# Patient Record
Sex: Male | Born: 1935 | Race: White | Hispanic: No
Health system: Southern US, Community
[De-identification: ages and names within clinical notes are randomized; demographics above are authoritative.]

---

## 2017-02-23 ENCOUNTER — Inpatient Hospital Stay
Admission: AD | Admit: 2017-02-23 | Discharge: 2017-03-17 | Disposition: A | Discharge status: Other Acute Inpatient Hospital | Payer: Self-pay | Source: Ambulatory Visit | Attending: Internal Medicine | Admitting: Internal Medicine

## 2017-02-23 ENCOUNTER — Other Ambulatory Visit (HOSPITAL_COMMUNITY): Payer: Self-pay

## 2017-02-23 DIAGNOSIS — K567 Ileus, unspecified: Secondary | ICD-10-CM

## 2017-02-23 DIAGNOSIS — Z931 Gastrostomy status: Secondary | ICD-10-CM

## 2017-02-23 DIAGNOSIS — R0902 Hypoxemia: Secondary | ICD-10-CM

## 2017-02-23 DIAGNOSIS — J9690 Respiratory failure, unspecified, unspecified whether with hypoxia or hypercapnia: Secondary | ICD-10-CM

## 2017-02-23 DIAGNOSIS — T68XXXA Hypothermia, initial encounter: Secondary | ICD-10-CM

## 2017-02-24 LAB — RENAL FUNCTION PANEL
ANION GAP: 10 (ref 5–15)
Albumin: 2.3 g/dL — ABNORMAL LOW (ref 3.5–5.0)
BUN: 97 mg/dL — ABNORMAL HIGH (ref 6–20)
CALCIUM: 8.2 mg/dL — AB (ref 8.9–10.3)
CHLORIDE: 92 mmol/L — AB (ref 101–111)
CO2: 26 mmol/L (ref 22–32)
Creatinine, Ser: 2.64 mg/dL — ABNORMAL HIGH (ref 0.61–1.24)
GFR calc non Af Amer: 21 mL/min — ABNORMAL LOW (ref >60)
GFR, EST AFRICAN AMERICAN: 24 mL/min — AB (ref >60)
Glucose, Bld: 194 mg/dL — ABNORMAL HIGH (ref 65–99)
Phosphorus: 3.2 mg/dL (ref 2.5–4.6)
Potassium: 4.6 mmol/L (ref 3.5–5.1)
Sodium: 128 mmol/L — ABNORMAL LOW (ref 135–145)

## 2017-02-24 LAB — CBC
HCT: 25.5 % — ABNORMAL LOW (ref 39.0–52.0)
HEMATOCRIT: 22.6 % — AB (ref 39.0–52.0)
HEMOGLOBIN: 7.4 g/dL — AB (ref 13.0–17.0)
HEMOGLOBIN: 8.2 g/dL — AB (ref 13.0–17.0)
MCH: 29.6 pg (ref 26.0–34.0)
MCH: 29.1 pg (ref 26.0–34.0)
MCHC: 32.7 g/dL (ref 30.0–36.0)
MCHC: 32.2 g/dL (ref 30.0–36.0)
MCV: 90.4 fL (ref 78.0–100.0)
MCV: 90.4 fL (ref 78.0–100.0)
Platelets: 221 10*3/uL (ref 150–400)
Platelets: 244 10*3/uL (ref 150–400)
RBC: 2.5 MIL/uL — ABNORMAL LOW (ref 4.22–5.81)
RBC: 2.82 MIL/uL — AB (ref 4.22–5.81)
RDW: 17.4 % — ABNORMAL HIGH (ref 11.5–15.5)
RDW: 17 % — ABNORMAL HIGH (ref 11.5–15.5)
WBC: 7.7 10*3/uL (ref 4.0–10.5)
WBC: 8.1 10*3/uL (ref 4.0–10.5)

## 2017-02-24 LAB — COMPREHENSIVE METABOLIC PANEL
ALT: 15 U/L — ABNORMAL LOW (ref 17–63)
ANION GAP: 10 (ref 5–15)
AST: 36 U/L (ref 15–41)
Albumin: 2.2 g/dL — ABNORMAL LOW (ref 3.5–5.0)
Alkaline Phosphatase: 372 U/L — ABNORMAL HIGH (ref 38–126)
BUN: 92 mg/dL — ABNORMAL HIGH (ref 6–20)
CHLORIDE: 93 mmol/L — AB (ref 101–111)
CO2: 26 mmol/L (ref 22–32)
Calcium: 8.2 mg/dL — ABNORMAL LOW (ref 8.9–10.3)
Creatinine, Ser: 2.59 mg/dL — ABNORMAL HIGH (ref 0.61–1.24)
GFR calc non Af Amer: 21 mL/min — ABNORMAL LOW (ref >60)
GFR, EST AFRICAN AMERICAN: 24 mL/min — AB (ref >60)
Glucose, Bld: 154 mg/dL — ABNORMAL HIGH (ref 65–99)
POTASSIUM: 4.5 mmol/L (ref 3.5–5.1)
SODIUM: 129 mmol/L — AB (ref 135–145)
Total Bilirubin: 0.9 mg/dL (ref 0.3–1.2)
Total Protein: 5.1 g/dL — ABNORMAL LOW (ref 6.5–8.1)

## 2017-02-24 LAB — PROTIME-INR
INR: 1.08
Prothrombin Time: 14.1 seconds (ref 11.4–15.2)

## 2017-02-24 NOTE — Consult Note (Signed)
[facility] CONSULT NOTE    Date: 02/24/2017                  Patient Name:  Terressa KoyanagiBillye E August  MRN: [id number]  DOB: [birth year]  Age / Sex: 83 y.o., male         PCP: System, Pcp Not In                 Service Requesting Consult: Hospitalist                 Reason for Consult: Acute renal failure, CKD stage III            History of Present Illness: Patient is a 83 y.o. male with a PMHx of congestive heart failure, chronic kidney disease stage III, atrial fibrillation, diabetes mellitus type 2, coronary artery disease, hypothyroidism, hyperlipidemia, who was admitted to [facility] on 02/23/2017 for ongoing treatment.  Patient originally underwent CABG on 01/11/2017. The patient had decompensation post surgical with a right ventricular failure and was placed on ECMO. Subsequently the patient was improved and was taken off of ECMO on 01/18/17. Thereafter he developed acute renal failure and ended up requiring continuous renal replacement therapy and then the patient was transitioned to an intermittent hemodialysis. In addition he had a tracheostomy placed on 02/08/17 as well as a PEG tube on 02/10/17. The patient was receiving hemodialysis on a Monday, Wednesday, and Friday dialysis schedule. He has a right internal jugular PermCath in place. He is due for hemodialysis today therefore.   Medications: Humalog sliding scale, atorvastatin 10 mg eac bedtime, Pepcid 20 mg daily, HCTZ 12.5 mg daily, Synthroid 150 mg daily, losartan 50 mg daily, Lovenox 30 mg daily, melatonin 3 mg each bedtime, metoprolol 50 mg twice a day, multivitamin 1 tablet daily, thiamine 100 mg daily  Current medications: No current facility-administered medications for this encounter.       Allergies: sulfa   Past Medical History: congestive heart failure, chronic kidney disease stage III, atrial fibrillation, diabetes mellitus type 2, coronary artery disease, hypothyroidism,  hyperlipidemia,  Past Surgical History: Status post CABG Tracheostomy 02/09/79 PEG tube 02/10/17 Right internal jugular PermCath  Family History: Unable to obtain from the patient as he has a tracheostomy  Social History: Unable to obtain from the patient as he has a tracheostomy in place.   Review of Systems: Unable to obtain from the patient as he has a tracheostomy in place.  Vital Signs: Temperature 96.7 pulse 76 respirations 20 blood pressure 99/54 Weight trends: There were no vitals filed for this visit.  Physical Exam: General: No acute distress  Head: Normocephalic, atraumatic.  Eyes: Anicteric, EOMI  Nose: Mucous membranes moist, not inflammed, nonerythematous.  Throat: Oropharynx nonerythematous, no exudate appreciated.   Neck: Tracheostomy in place  Lungs:  Normal respiratory effort. Scattered rhonchi bilateral.  Heart: S1, S2, no rubs or gallops  Abdomen:  BS normoactive. Soft, Nondistended, non-tender.  No masses or organomegaly.  Extremities: 1+ peripheral edema  Neurologic: Awake, alert, following commands  Skin: No visible rashes, scars.    Lab results: Basic Metabolic Panel:  Recent Labs Lab 02/24/17 0626  NA 129*  K 4.5  CL 93*  CO2 26  GLUCOSE 154*  BUN 92*  CREATININE 2.59*  CALCIUM 8.2*    Liver Function Tests:  Recent Labs Lab 02/24/17 0626  AST 36  ALT 15*  ALKPHOS 372*  BILITOT 0.9  PROT 5.1*  ALBUMIN 2.2*   No  results for input(s): LIPASE, AMYLASE in the last 168 hours. No results for input(s): AMMONIA in the last 168 hours.  CBC:  Recent Labs Lab 02/24/17 0626  WBC 7.7  HGB 7.4*  HCT 22.6*  MCV 90.4  PLT 221    Cardiac Enzymes: No results for input(s): CKTOTAL, CKMB, CKMBINDEX, TROPONINI in the last 168 hours.  BNP: Invalid input(s): POCBNP  CBG: No results for input(s): GLUCAP in the last 168 hours.  Microbiology: No results found for this or any previous visit.  Coagulation Studies:  Recent Labs   02/24/17 0626  LABPROT 14.1  INR 1.08    Urinalysis: No results for input(s): COLORURINE, LABSPEC, PHURINE, GLUCOSEU, HGBUR, BILIRUBINUR, KETONESUR, PROTEINUR, UROBILINOGEN, NITRITE, LEUKOCYTESUR in the last 72 hours.  Invalid input(s): APPERANCEUR    Imaging: Dg Chest Port 1 View  Result Date: 02/23/2017 CLINICAL DATA:  Respiratory failure EXAM: PORTABLE CHEST 1 VIEW COMPARISON:  None. FINDINGS: A tracheostomy tube is identified. A double-lumen central line on the right is in good position. The distal tip of a right PICC line is obscured by the right central line. No pneumothorax. Probable effusions and underlying atelectasis, particularly on the right. Mild opacity in left mid lung may represent atelectasis or early infiltrate. No other acute abnormalities. No overt edema. IMPRESSION: Probable effusions and underlying atelectasis. Atelectasis versus early infiltrate in the left mid lung. Recommend attention on follow-up. Electronically Signed   By: Gerome Sam III M.D   On: 02/23/2017 17:33   Dg Abd Portable 1v  Result Date: 02/23/2017 CLINICAL DATA:  Evaluate percutaneous G-tube placement EXAM: PORTABLE ABDOMEN - 1 VIEW COMPARISON:  None. FINDINGS: Radioactive seeds are seen in the prostate bed. Prominent air-filled loops of large and small bowel may represent ileus. A G-tube overlies the proximal body of the stomach. No other acute abnormalities. IMPRESSION: A G tube overlies the proximal body of the stomach. Air-filled loops of large and small bowel could represent ileus. Recommend clinical correlation and follow-up as clinically warranted. Electronically Signed   By: Gerome Sam III M.D   On: 02/23/2017 17:35      Assessment & Plan: Pt is a 83 y.o. male congestive heart failure, chronic kidney disease stage III, atrial fibrillation, diabetes mellitus type 2, coronary artery disease, hypothyroidism, hyperlipidemia, who was admitted to [facility] on 02/23/2017 for  ongoing treatment.  Patient originally underwent CABG on 01/11/2017. The patient had decompensation post surgical with a right ventricular failure and was placed on ECMO, taken off of ECMO on 01/18/17. He then developed acute renal failure and ended up requiring continuous renal replacement therapy and then the patient was transitioned to an intermittent hemodialysis. Patient had tracheostomy placed on 02/08/17 as well as a PEG tube on 02/10/17.   1.  Acute renal failure/chronic kidney disease stage III. The patient has been requiring intermittent hemodialysis. We will continue this for now. He has a right internal jugular PermCath in place which we will use. We will plan for dialysis today with ultrafiltration target of 1.5 kg.  2. Anemia of chronic kidney disease. Hemoglobin currently 7.4. We will start the patient on Aranesp 60 mcg subcutaneous weekly.  3. Secondary hyperparathyroidism. Plan to check PTH as well as phosphorus with hemodialysis.  4. Acute respiratory failure. Patient currently off the Allegra but still has a tracheostomy in place. He appears to be progressing relatively well.  5. Thanks for consultation.

## 2017-02-25 LAB — BASIC METABOLIC PANEL
ANION GAP: 9 (ref 5–15)
BUN: 60 mg/dL — ABNORMAL HIGH (ref 6–20)
CHLORIDE: 98 mmol/L — AB (ref 101–111)
CO2: 25 mmol/L (ref 22–32)
Calcium: 8.2 mg/dL — ABNORMAL LOW (ref 8.9–10.3)
Creatinine, Ser: 1.97 mg/dL — ABNORMAL HIGH (ref 0.61–1.24)
GFR calc Af Amer: 34 mL/min — ABNORMAL LOW (ref >60)
GFR calc non Af Amer: 29 mL/min — ABNORMAL LOW (ref >60)
GLUCOSE: 161 mg/dL — AB (ref 65–99)
Potassium: 4.1 mmol/L (ref 3.5–5.1)
Sodium: 132 mmol/L — ABNORMAL LOW (ref 135–145)

## 2017-02-27 LAB — RENAL FUNCTION PANEL
ANION GAP: 9 (ref 5–15)
Albumin: 2.1 g/dL — ABNORMAL LOW (ref 3.5–5.0)
BUN: 92 mg/dL — ABNORMAL HIGH (ref 6–20)
CO2: 27 mmol/L (ref 22–32)
Calcium: 8.1 mg/dL — ABNORMAL LOW (ref 8.9–10.3)
Chloride: 95 mmol/L — ABNORMAL LOW (ref 101–111)
Creatinine, Ser: 2.52 mg/dL — ABNORMAL HIGH (ref 0.61–1.24)
GFR, EST AFRICAN AMERICAN: 25 mL/min — AB (ref >60)
GFR, EST NON AFRICAN AMERICAN: 22 mL/min — AB (ref >60)
Glucose, Bld: 222 mg/dL — ABNORMAL HIGH (ref 65–99)
PHOSPHORUS: 3.3 mg/dL (ref 2.5–4.6)
POTASSIUM: 3.7 mmol/L (ref 3.5–5.1)
SODIUM: 131 mmol/L — AB (ref 135–145)

## 2017-02-27 LAB — CBC
HEMATOCRIT: 23.4 % — AB (ref 39.0–52.0)
HEMOGLOBIN: 7.3 g/dL — AB (ref 13.0–17.0)
MCH: 29.3 pg (ref 26.0–34.0)
MCHC: 31.2 g/dL (ref 30.0–36.0)
MCV: 94 fL (ref 78.0–100.0)
Platelets: 216 10*3/uL (ref 150–400)
RBC: 2.49 MIL/uL — AB (ref 4.22–5.81)
RDW: 17.8 % — ABNORMAL HIGH (ref 11.5–15.5)
WBC: 5.7 10*3/uL (ref 4.0–10.5)

## 2017-02-27 LAB — HEPATITIS B CORE ANTIBODY, IGM: HEP B C IGM: NEGATIVE

## 2017-02-27 LAB — HEPATITIS B SURFACE ANTIGEN: Hepatitis B Surface Ag: NEGATIVE

## 2017-02-27 LAB — HEPATITIS B SURFACE ANTIBODY,QUALITATIVE: Hep B S Ab: NONREACTIVE

## 2017-02-27 NOTE — Progress Notes (Signed)
[facility]  ROUNDING NOTE   Subjective:  Patient completed hemodialysis today. Blood pressure did not allow for significant ultrafiltration. Patient was given albumin.    Objective:  Vital signs in last 24 hours:  Temperature 98 pulse 70 respirations 20 blood pressure 142/68   Physical Exam: General: No acute distress  Head: Normocephalic, atraumatic. Moist oral mucosal membranes  Eyes: Anicteric  Neck: Trach in place, ATC  Lungs:  Scattered rhonchi, normal effort  Heart: S1S2 no rubs  Abdomen:  Soft, nontender, bowel sounds present  Extremities: trace peripheral edema.  Neurologic: Awake, alert, following commands  Skin: No lesions       Basic Metabolic Panel:  Recent Labs Lab 02/24/17 0626 02/24/17 1900 02/25/17 1239 02/27/17 0631  NA 129* 128* 132* 131*  K 4.5 4.6 4.1 3.7  CL 93* 92* 98* 95*  CO2 26 26 25 27   GLUCOSE 154* 194* 161* 222*  BUN 92* 97* 60* 92*  CREATININE 2.59* 2.64* 1.97* 2.52*  CALCIUM 8.2* 8.2* 8.2* 8.1*  PHOS  --  3.2  --  3.3    Liver Function Tests:  Recent Labs Lab 02/24/17 0626 02/24/17 1900 02/27/17 0631  AST 36  --   --   ALT 15*  --   --   ALKPHOS 372*  --   --   BILITOT 0.9  --   --   PROT 5.1*  --   --   ALBUMIN 2.2* 2.3* 2.1*   No results for input(s): LIPASE, AMYLASE in the last 168 hours. No results for input(s): AMMONIA in the last 168 hours.  CBC:  Recent Labs Lab 02/24/17 0626 02/24/17 1900 02/27/17 0632  WBC 7.7 8.1 5.7  HGB 7.4* 8.2* 7.3*  HCT 22.6* 25.5* 23.4*  MCV 90.4 90.4 94.0  PLT 221 244 216    Cardiac Enzymes: No results for input(s): CKTOTAL, CKMB, CKMBINDEX, TROPONINI in the last 168 hours.  BNP: Invalid input(s): POCBNP  CBG: No results for input(s): GLUCAP in the last 168 hours.  Microbiology: No results found for this or any previous visit.  Coagulation Studies: No results for input(s): LABPROT, INR in the last 72 hours.  Urinalysis: No results for input(s):  COLORURINE, LABSPEC, PHURINE, GLUCOSEU, HGBUR, BILIRUBINUR, KETONESUR, PROTEINUR, UROBILINOGEN, NITRITE, LEUKOCYTESUR in the last 72 hours.  Invalid input(s): APPERANCEUR    Imaging: No results found.   Medications:       Assessment/ Plan:  83 y.o. male congestive heart failure, chronic kidney disease stage III, atrial fibrillation, diabetes mellitus type 2, coronary artery disease, hypothyroidism, hyperlipidemia, who was [facility] on 02/23/2017 for ongoing treatment.  Patient originally underwent CABG on 01/11/2017. The patient had decompensation post surgical with a right ventricular failure and was placed on ECMO, taken off of ECMO on 01/18/17. He then developed acute renal failure and ended up requiring continuous renal replacement therapy and then the patient was transitioned to an intermittent hemodialysis. Patient had tracheostomy placed on 02/08/17 as well as a PEG tube on 02/10/17.   1.  Acute renal failure/chronic kidney disease stage III. The patient has been requiring intermittent hemodialysis.  - patient completed hemodialysis today. Ultrafiltration was not able to be performed givn her relatively low blood pressure. We will use albumin with dialysis treatments.  2. Anemia of chronic kidney disease. Start Aranesp 60 mcg subcutaneous weekly this week.  3. Secondary hyperparathyroidism. Phosphorus 3.3 and at target. Continue to monitor.  4. Acute respiratory failure.  Tracheostomy functioning well at  the moment.   LOS: 0 Tristan Proto 6/11/20185:16 PM

## 2017-02-28 ENCOUNTER — Other Ambulatory Visit (HOSPITAL_COMMUNITY): Payer: Self-pay

## 2017-03-01 LAB — RENAL FUNCTION PANEL
Albumin: 2.7 g/dL — ABNORMAL LOW (ref 3.5–5.0)
Anion gap: 10 (ref 5–15)
BUN: 68 mg/dL — AB (ref 6–20)
CHLORIDE: 93 mmol/L — AB (ref 101–111)
CO2: 26 mmol/L (ref 22–32)
CREATININE: 2.23 mg/dL — AB (ref 0.61–1.24)
Calcium: 8.3 mg/dL — ABNORMAL LOW (ref 8.9–10.3)
GFR calc non Af Amer: 25 mL/min — ABNORMAL LOW (ref >60)
GFR, EST AFRICAN AMERICAN: 29 mL/min — AB (ref >60)
Glucose, Bld: 253 mg/dL — ABNORMAL HIGH (ref 65–99)
POTASSIUM: 4.6 mmol/L (ref 3.5–5.1)
Phosphorus: 3.7 mg/dL (ref 2.5–4.6)
Sodium: 129 mmol/L — ABNORMAL LOW (ref 135–145)

## 2017-03-01 LAB — CBC
HEMATOCRIT: 27.1 % — AB (ref 39.0–52.0)
HEMOGLOBIN: 8.1 g/dL — AB (ref 13.0–17.0)
MCH: 29.1 pg (ref 26.0–34.0)
MCHC: 29.9 g/dL — ABNORMAL LOW (ref 30.0–36.0)
MCV: 97.5 fL (ref 78.0–100.0)
Platelets: 205 10*3/uL (ref 150–400)
RBC: 2.78 MIL/uL — AB (ref 4.22–5.81)
RDW: 18 % — ABNORMAL HIGH (ref 11.5–15.5)
WBC: 8.4 10*3/uL (ref 4.0–10.5)

## 2017-03-01 NOTE — Progress Notes (Signed)
[facility]  ROUNDING NOTE   Subjective:  Patient currently back on the ventilator. He did complete hemodialysis today and 1.2 L of ultrafiltration was performed.     Objective:  Vital signs in last 24 hours:  Temperature 95.6 pulse 71 respirations 26 blood pressure 91/42   Physical Exam: General: No acute distress  Head: Normocephalic, atraumatic. Moist oral mucosal membranes  Eyes: Anicteric  Neck: Trach in place, ATC  Lungs:  Scattered rhonchi, back on ventilator  Heart: S1S2 no rubs  Abdomen:  Soft, nontender, bowel sounds present  Extremities: trace peripheral edema.  Neurologic: Lethargic but arousable  Skin: No lesions       Basic Metabolic Panel:  Recent Labs Lab 02/24/17 0626 02/24/17 1900 02/25/17 1239 02/27/17 0631 03/01/17 0532  NA 129* 128* 132* 131* 129*  K 4.5 4.6 4.1 3.7 4.6  CL 93* 92* 98* 95* 93*  CO2 26 26 25 27 26   GLUCOSE 154* 194* 161* 222* 253*  BUN 92* 97* 60* 92* 68*  CREATININE 2.59* 2.64* 1.97* 2.52* 2.23*  CALCIUM 8.2* 8.2* 8.2* 8.1* 8.3*  PHOS  --  3.2  --  3.3 3.7    Liver Function Tests:  Recent Labs Lab 02/24/17 0626 02/24/17 1900 02/27/17 0631 03/01/17 0532  AST 36  --   --   --   ALT 15*  --   --   --   ALKPHOS 372*  --   --   --   BILITOT 0.9  --   --   --   PROT 5.1*  --   --   --   ALBUMIN 2.2* 2.3* 2.1* 2.7*   No results for input(s): LIPASE, AMYLASE in the last 168 hours. No results for input(s): AMMONIA in the last 168 hours.  CBC:  Recent Labs Lab 02/24/17 0626 02/24/17 1900 02/27/17 0632 03/01/17 0532  WBC 7.7 8.1 5.7 8.4  HGB 7.4* 8.2* 7.3* 8.1*  HCT 22.6* 25.5* 23.4* 27.1*  MCV 90.4 90.4 94.0 97.5  PLT 221 244 216 205    Cardiac Enzymes: No results for input(s): CKTOTAL, CKMB, CKMBINDEX, TROPONINI in the last 168 hours.  BNP: Invalid input(s): POCBNP  CBG: No results for input(s): GLUCAP in the last 168 hours.  Microbiology: No results found for this or any previous  visit.  Coagulation Studies: No results for input(s): LABPROT, INR in the last 72 hours.  Urinalysis: No results for input(s): COLORURINE, LABSPEC, PHURINE, GLUCOSEU, HGBUR, BILIRUBINUR, KETONESUR, PROTEINUR, UROBILINOGEN, NITRITE, LEUKOCYTESUR in the last 72 hours.  Invalid input(s): APPERANCEUR    Imaging: Dg Chest Port 1 View  Result Date: 02/28/2017 CLINICAL DATA:  Hypoxia. EXAM: PORTABLE CHEST 1 VIEW COMPARISON:  02/23/2017 FINDINGS: Patient rotated to the right. Tracheostomy appropriately positioned. Prior median sternotomy. Dialysis catheter tip at low SVC. Probable mitral valve repair. Cardiomegaly accentuated by AP portable technique. Layering left pleural effusion including with extension into the fissure. This is new or increased. Right hemidiaphragm elevation. No pneumothorax. Mild interstitial edema, superimposed upon low lung volumes. Bibasilar airspace disease persists. IMPRESSION: Slightly worsened aeration with development of interstitial edema and small left pleural effusion. Persistent bibasilar airspace disease. Electronically Signed   By: Jeronimo GreavesKyle  Talbot M.D.   On: 02/28/2017 15:38     Medications:       Assessment/ Plan:  83 y.o. male congestive heart failure, chronic kidney disease stage III, atrial fibrillation, diabetes mellitus type 2, coronary artery disease, hypothyroidism, hyperlipidemia, who was [facility] 02/23/2017  for ongoing treatment.  Patient originally underwent CABG on 01/11/2017. The patient had decompensation post surgical with a right ventricular failure and was placed on ECMO, taken off of ECMO on 01/18/17. He then developed acute renal failure and ended up requiring continuous renal replacement therapy and then the patient was transitioned to an intermittent hemodialysis. Patient had tracheostomy placed on 02/08/17 as well as a PEG tube on 02/10/17.   1.  Acute renal failure/chronic kidney disease stage III. The patient has  been requiring intermittent hemodialysis.  - the patient did complete hemodialysis today. Ultrafiltration and she was 1.2 kg. We will continue dialysis on an WF schedule and continue to use albumin with dialysis.  2. Anemia of chronic kidney disease. Hemoglobin up to 8.1. Continued patient on Aranesp.  3. Secondary hyperparathyroidism. Phosphorus 3.7 and the target. Continue to monitor.  4. Acute respiratory failure.  At the last visit the patient was on aerosolized trach collar. He is currently back on the ventilator.   LOS: 0 Reily Ilic 6/13/20182:36 PM

## 2017-03-02 LAB — CBC
HEMATOCRIT: 25.4 % — AB (ref 39.0–52.0)
HEMOGLOBIN: 7.8 g/dL — AB (ref 13.0–17.0)
MCH: 29.7 pg (ref 26.0–34.0)
MCHC: 30.7 g/dL (ref 30.0–36.0)
MCV: 96.6 fL (ref 78.0–100.0)
Platelets: 172 10*3/uL (ref 150–400)
RBC: 2.63 MIL/uL — ABNORMAL LOW (ref 4.22–5.81)
RDW: 18.3 % — ABNORMAL HIGH (ref 11.5–15.5)
WBC: 10.7 10*3/uL — ABNORMAL HIGH (ref 4.0–10.5)

## 2017-03-02 LAB — PTH, INTACT AND CALCIUM
CALCIUM TOTAL (PTH): 8.3 mg/dL — AB (ref 8.6–10.2)
PTH: 46 pg/mL (ref 15–65)

## 2017-03-02 LAB — URINALYSIS, ROUTINE W REFLEX MICROSCOPIC
Bilirubin Urine: NEGATIVE
Glucose, UA: NEGATIVE mg/dL
HGB URINE DIPSTICK: NEGATIVE
KETONES UR: NEGATIVE mg/dL
LEUKOCYTES UA: NEGATIVE
Nitrite: NEGATIVE
PROTEIN: NEGATIVE mg/dL
Specific Gravity, Urine: 1.012 (ref 1.005–1.030)
pH: 5 (ref 5.0–8.0)

## 2017-03-03 ENCOUNTER — Other Ambulatory Visit (HOSPITAL_COMMUNITY): Payer: Self-pay

## 2017-03-03 LAB — CBC
HEMATOCRIT: 23.1 % — AB (ref 39.0–52.0)
HEMOGLOBIN: 7.2 g/dL — AB (ref 13.0–17.0)
MCH: 29.6 pg (ref 26.0–34.0)
MCHC: 31.2 g/dL (ref 30.0–36.0)
MCV: 95.1 fL (ref 78.0–100.0)
Platelets: 161 10*3/uL (ref 150–400)
RBC: 2.43 MIL/uL — AB (ref 4.22–5.81)
RDW: 18.4 % — ABNORMAL HIGH (ref 11.5–15.5)
WBC: 8.5 10*3/uL (ref 4.0–10.5)

## 2017-03-03 LAB — RENAL FUNCTION PANEL
Albumin: 2.3 g/dL — ABNORMAL LOW (ref 3.5–5.0)
Anion gap: 10 (ref 5–15)
BUN: 69 mg/dL — ABNORMAL HIGH (ref 6–20)
CO2: 25 mmol/L (ref 22–32)
CREATININE: 2.24 mg/dL — AB (ref 0.61–1.24)
Calcium: 8 mg/dL — ABNORMAL LOW (ref 8.9–10.3)
Chloride: 94 mmol/L — ABNORMAL LOW (ref 101–111)
GFR, EST AFRICAN AMERICAN: 29 mL/min — AB (ref >60)
GFR, EST NON AFRICAN AMERICAN: 25 mL/min — AB (ref >60)
Glucose, Bld: 155 mg/dL — ABNORMAL HIGH (ref 65–99)
POTASSIUM: 3.9 mmol/L (ref 3.5–5.1)
Phosphorus: 1.8 mg/dL — ABNORMAL LOW (ref 2.5–4.6)
Sodium: 129 mmol/L — ABNORMAL LOW (ref 135–145)

## 2017-03-03 NOTE — Progress Notes (Signed)
[facility]  ROUNDING NOTE   Subjective:  Patient seen and evaluated during hemodialysis. Patient has been given albumin x2. Ultrafiltration remains difficult however given her relative hypotension.      Objective:  Vital signs in last 24 hours:  Temperature 95.4 pulse 68 respirations 22 blood pressure 92/68   Physical Exam: General: No acute distress  Head: Normocephalic, atraumatic. Moist oral mucosal membranes  Eyes: Anicteric  Neck: Trach in place  Lungs:  Scattered rhonchi, back on ventilator  Heart: S1S2 no rubs  Abdomen:  Soft, nontender, bowel sounds present  Extremities: 2+ lower generalized edema  Neurologic: Lethargic but arousable  Skin: No lesions       Basic Metabolic Panel:  Recent Labs Lab 02/24/17 1900 02/25/17 1239 02/27/17 0631 03/01/17 0532 03/03/17 0526  NA 128* 132* 131* 129* 129*  K 4.6 4.1 3.7 4.6 3.9  CL 92* 98* 95* 93* 94*  CO2 26 25 27 26 25   GLUCOSE 194* 161* 222* 253* 155*  BUN 97* 60* 92* 68* 69*  CREATININE 2.64* 1.97* 2.52* 2.23* 2.24*  CALCIUM 8.2* 8.2* 8.1* 8.3*  8.3* 8.0*  PHOS 3.2  --  3.3 3.7 1.8*    Liver Function Tests:  Recent Labs Lab 02/24/17 1900 02/27/17 0631 03/01/17 0532 03/03/17 0526  ALBUMIN 2.3* 2.1* 2.7* 2.3*   No results for input(s): LIPASE, AMYLASE in the last 168 hours. No results for input(s): AMMONIA in the last 168 hours.  CBC:  Recent Labs Lab 02/24/17 1900 02/27/17 0632 03/01/17 0532 03/02/17 1613 03/03/17 0526  WBC 8.1 5.7 8.4 10.7* 8.5  HGB 8.2* 7.3* 8.1* 7.8* 7.2*  HCT 25.5* 23.4* 27.1* 25.4* 23.1*  MCV 90.4 94.0 97.5 96.6 95.1  PLT 244 216 205 172 161    Cardiac Enzymes: No results for input(s): CKTOTAL, CKMB, CKMBINDEX, TROPONINI in the last 168 hours.  BNP: Invalid input(s): POCBNP  CBG: No results for input(s): GLUCAP in the last 168 hours.  Microbiology: Results for orders placed or performed during the hospital encounter of 02/23/17  Culture,  respiratory (NON-Expectorated)     Status: None (Preliminary result)   Collection Time: 03/03/17  5:05 AM  Result Value Ref Range Status   Specimen Description TRACHEAL ASPIRATE  Final   Special Requests NONE  Final   Gram Stain   Final    MODERATE WBC PRESENT, PREDOMINANTLY PMN RARE SQUAMOUS EPITHELIAL CELLS PRESENT NO ORGANISMS SEEN    Culture PENDING  Incomplete   Report Status PENDING  Incomplete    Coagulation Studies: No results for input(s): LABPROT, INR in the last 72 hours.  Urinalysis:  Recent Labs  03/02/17 1808  COLORURINE AMBER*  LABSPEC 1.012  PHURINE 5.0  GLUCOSEU NEGATIVE  HGBUR NEGATIVE  BILIRUBINUR NEGATIVE  KETONESUR NEGATIVE  PROTEINUR NEGATIVE  NITRITE NEGATIVE  LEUKOCYTESUR NEGATIVE      Imaging: Dg Chest Port 1 View  Result Date: 03/03/2017 CLINICAL DATA:  Follow-up tracheostomy EXAM: PORTABLE CHEST 1 VIEW COMPARISON:  02/28/2017 FINDINGS: Cardiac shadow is enlarged but stable. Postsurgical changes are again seen. Tracheostomy tube and right jugular dialysis catheter are again noted. Left pleural effusion has nearly completely resolved. Persistent bibasilar changes are seen. No bony abnormality is noted. IMPRESSION: Stable bibasilar changes. Improvement in left pleural effusion from the prior exam. Electronically Signed   By: Alcide Clever M.D.   On: 03/03/2017 08:04     Medications:       Assessment/ Plan:  83 y.o. male congestive heart failure, chronic kidney disease  stage III, atrial fibrillation, diabetes mellitus type 2, coronary artery disease, hypothyroidism, hyperlipidemia, who was [facility]23/2017 for ongoing treatment.  Patient originally underwent CABG on 01/11/2017. The patient had decompensation post surgical with a right ventricular failure and was placed on ECMO, taken off of ECMO on 01/18/17. He then developed acute renal failure and ended up requiring continuous renal replacement therapy and then  the patient was transitioned to an intermittent hemodialysis. Patient had tracheostomy placed on 02/08/17 as well as a PEG tube on 02/10/17.   1.  Acute renal failure/chronic kidney disease stage III. The patient has been requiring intermittent hemodialysis.  - patient seen and evaluated during hemodialysis. Ultrafiltration is proving to be difficult. We will add midodrine to his medication regimen for blood pressure support.  2. Anemia of chronic kidney disease. Hemoglobin drifting down. Currently hemoglobin 7.2. Consider blood transfusion for hemoglobin of 7 or less.  3. Secondary hyperparathyroidism. Phosphorus was low at 1.8 today. Followup on Monday.  4. Acute respiratory failure.  Patient remains on the ventilator at this point in time. Continue ventilatory support.   LOS: 0 Blimi Godby 6/15/20183:25 PM

## 2017-03-04 LAB — URINE CULTURE: Culture: NO GROWTH

## 2017-03-05 LAB — CULTURE, RESPIRATORY W GRAM STAIN: Culture: NORMAL

## 2017-03-06 LAB — BASIC METABOLIC PANEL
ANION GAP: 8 (ref 5–15)
BUN: 73 mg/dL — ABNORMAL HIGH (ref 6–20)
CHLORIDE: 90 mmol/L — AB (ref 101–111)
CO2: 26 mmol/L (ref 22–32)
CREATININE: 2.75 mg/dL — AB (ref 0.61–1.24)
Calcium: 8 mg/dL — ABNORMAL LOW (ref 8.9–10.3)
GFR calc non Af Amer: 20 mL/min — ABNORMAL LOW (ref >60)
GFR, EST AFRICAN AMERICAN: 23 mL/min — AB (ref >60)
Glucose, Bld: 119 mg/dL — ABNORMAL HIGH (ref 65–99)
Potassium: 4.2 mmol/L (ref 3.5–5.1)
SODIUM: 124 mmol/L — AB (ref 135–145)

## 2017-03-06 LAB — CBC
HEMATOCRIT: 22.6 % — AB (ref 39.0–52.0)
HEMOGLOBIN: 7.2 g/dL — AB (ref 13.0–17.0)
MCH: 29.9 pg (ref 26.0–34.0)
MCHC: 31.9 g/dL (ref 30.0–36.0)
MCV: 93.8 fL (ref 78.0–100.0)
Platelets: 153 10*3/uL (ref 150–400)
RBC: 2.41 MIL/uL — AB (ref 4.22–5.81)
RDW: 19.3 % — ABNORMAL HIGH (ref 11.5–15.5)
WBC: 8.6 10*3/uL (ref 4.0–10.5)

## 2017-03-06 LAB — AMMONIA: Ammonia: 44 umol/L — ABNORMAL HIGH (ref 9–35)

## 2017-03-06 NOTE — Progress Notes (Signed)
[facility]  ROUNDING NOTE   Subjective:  Patient completed hemodialysis today. He remains on the ventilator at this time. BUN did increase over the weekend.   Objective:  Vital signs in last 24 hours:  Temperature 96.4 pulse 66 respirations 20 blood pressure 102/56   Physical Exam: General: No acute distress  Head: Normocephalic, atraumatic. Moist oral mucosal membranes  Eyes: Anicteric  Neck: Trach in place  Lungs:  Scattered rhonchi, on ventilator  Heart: S1S2 no rubs  Abdomen:  Soft, nontender, bowel sounds present  Extremities: 1+ generalized edema  Neurologic: Awake, alert, follows commands  Skin: No lesions       Basic Metabolic Panel:  Recent Labs Lab 03/01/17 0532 03/03/17 0526 03/06/17 0800  NA 129* 129* 124*  K 4.6 3.9 4.2  CL 93* 94* 90*  CO2 26 25 26   GLUCOSE 253* 155* 119*  BUN 68* 69* 73*  CREATININE 2.23* 2.24* 2.75*  CALCIUM 8.3*  8.3* 8.0* 8.0*  PHOS 3.7 1.8*  --     Liver Function Tests:  Recent Labs Lab 03/01/17 0532 03/03/17 0526  ALBUMIN 2.7* 2.3*   No results for input(s): LIPASE, AMYLASE in the last 168 hours.  Recent Labs Lab 03/06/17 1227  AMMONIA 44*    CBC:  Recent Labs Lab 03/01/17 0532 03/02/17 1613 03/03/17 0526 03/06/17 0800  WBC 8.4 10.7* 8.5 8.6  HGB 8.1* 7.8* 7.2* 7.2*  HCT 27.1* 25.4* 23.1* 22.6*  MCV 97.5 96.6 95.1 93.8  PLT 205 172 161 153    Cardiac Enzymes: No results for input(s): CKTOTAL, CKMB, CKMBINDEX, TROPONINI in the last 168 hours.  BNP: Invalid input(s): POCBNP  CBG: No results for input(s): GLUCAP in the last 168 hours.  Microbiology: Results for orders placed or performed during the hospital encounter of 02/23/17  Culture, Urine     Status: None   Collection Time: 03/02/17  6:08 PM  Result Value Ref Range Status   Specimen Description URINE, RANDOM  Final   Special Requests NONE  Final   Culture NO GROWTH  Final   Report Status 03/04/2017 FINAL  Final   Culture, respiratory (NON-Expectorated)     Status: None   Collection Time: 03/03/17  5:05 AM  Result Value Ref Range Status   Specimen Description TRACHEAL ASPIRATE  Final   Special Requests NONE  Final   Gram Stain   Final    MODERATE WBC PRESENT, PREDOMINANTLY PMN RARE SQUAMOUS EPITHELIAL CELLS PRESENT NO ORGANISMS SEEN    Culture Consistent with normal respiratory flora.  Final   Report Status 03/05/2017 FINAL  Final  Culture, blood (routine x 2)     Status: None (Preliminary result)   Collection Time: 03/03/17  5:25 AM  Result Value Ref Range Status   Specimen Description BLOOD LEFT HAND  Final   Special Requests   Final    BOTTLES DRAWN AEROBIC ONLY Blood Culture adequate volume   Culture NO GROWTH 3 DAYS  Final   Report Status PENDING  Incomplete  Culture, blood (routine x 2)     Status: None (Preliminary result)   Collection Time: 03/03/17  5:31 AM  Result Value Ref Range Status   Specimen Description BLOOD RIGHT HAND  Final   Special Requests IN PEDIATRIC BOTTLE Blood Culture adequate volume  Final   Culture NO GROWTH 3 DAYS  Final   Report Status PENDING  Incomplete    Coagulation Studies: No results for input(s): LABPROT, INR in the last 72 hours.  Urinalysis:  No results for input(s): COLORURINE, LABSPEC, PHURINE, GLUCOSEU, HGBUR, BILIRUBINUR, KETONESUR, PROTEINUR, UROBILINOGEN, NITRITE, LEUKOCYTESUR in the last 72 hours.  Invalid input(s): APPERANCEUR    Imaging: No results found.   Medications:       Assessment/ Plan:  83 y.o. male congestive heart failure, chronic kidney disease stage III, atrial fibrillation, diabetes mellitus type 2, coronary artery disease, hypothyroidism, hyperlipidemia, who was admitted to [facility] on 02/23/2017 for ongoing treatment.  Patient originally underwent CABG on 01/11/2017. The patient had decompensation post surgical with a right ventricular failure and was placed on ECMO, taken off of ECMO on 01/18/17. He  then developed acute renal failure and ended up requiring continuous renal replacement therapy and then the patient was transitioned to an intermittent hemodialysis. Patient had tracheostomy placed on 02/08/17 as well as a PEG tube on 02/10/17.   1.  Acute renal failure/chronic kidney disease stage III. The patient has been requiring intermittent hemodialysis.  - BUN and creatinine did rise over the weekend. Therefore at the moment it appears that he is dialysis dependent. Patient completed hemodialysis today. We will plan for dialysis again on Wednesday using albumin for blood pressure support.  2. Anemia of chronic kidney disease.  Hemoglobin 7.2 today. Continue to monitor. Consider blood transfusion for hemoglobinof 7 or less.  3. Secondary hyperparathyroidism. No new phosphorus at the moment. We will reorder phosphorus for Wednesday.  4. Acute respiratory failure.  Previously the patient was on aerosolized trach collar. However he is now back on the ventilator. Continue ventilatory support.   LOS: 0 Sparrow Siracusa 6/18/20183:55 PM

## 2017-03-08 LAB — CULTURE, BLOOD (ROUTINE X 2)
Culture: NO GROWTH
Culture: NO GROWTH
Special Requests: ADEQUATE
Special Requests: ADEQUATE

## 2017-03-08 LAB — RENAL FUNCTION PANEL
Albumin: 2.3 g/dL — ABNORMAL LOW (ref 3.5–5.0)
Anion gap: 9 (ref 5–15)
BUN: 62 mg/dL — AB (ref 6–20)
CHLORIDE: 92 mmol/L — AB (ref 101–111)
CO2: 27 mmol/L (ref 22–32)
Calcium: 8.2 mg/dL — ABNORMAL LOW (ref 8.9–10.3)
Creatinine, Ser: 2.14 mg/dL — ABNORMAL HIGH (ref 0.61–1.24)
GFR calc Af Amer: 31 mL/min — ABNORMAL LOW (ref >60)
GFR calc non Af Amer: 26 mL/min — ABNORMAL LOW (ref >60)
Glucose, Bld: 156 mg/dL — ABNORMAL HIGH (ref 65–99)
POTASSIUM: 3.9 mmol/L (ref 3.5–5.1)
Phosphorus: 1.7 mg/dL — ABNORMAL LOW (ref 2.5–4.6)
Sodium: 128 mmol/L — ABNORMAL LOW (ref 135–145)

## 2017-03-08 LAB — CBC
HEMATOCRIT: 24.9 % — AB (ref 39.0–52.0)
Hemoglobin: 7.9 g/dL — ABNORMAL LOW (ref 13.0–17.0)
MCH: 29.8 pg (ref 26.0–34.0)
MCHC: 31.7 g/dL (ref 30.0–36.0)
MCV: 94 fL (ref 78.0–100.0)
PLATELETS: 166 10*3/uL (ref 150–400)
RBC: 2.65 MIL/uL — ABNORMAL LOW (ref 4.22–5.81)
RDW: 19.6 % — AB (ref 11.5–15.5)
WBC: 8.5 10*3/uL (ref 4.0–10.5)

## 2017-03-08 NOTE — Progress Notes (Signed)
[facility]  ROUNDING NOTE   Subjective:  Patient due for hemodialysisMonday, Wednesday, Friday schedule. He is awake and alert this afternoon.    Objective:  Vital signs in last 24 hours:  Temperature 96.4 pulse 57 respirations 14 blood pressure 13775   Physical Exam: General: No acute distress  Head: Normocephalic, atraumatic. Moist oral mucosal membranes  Eyes: Anicteric  Neck: Trach in place  Lungs:  Scattered rhonchi, on ventilator  Heart: S1S2 no rubs  Abdomen:  Soft, nontender, bowel sounds present  Extremities: 1+ generalized edema  Neurologic: Awake, alert, follows commands  Skin: No lesions       Basic Metabolic Panel:  Recent Labs Lab 03/03/17 0526 03/06/17 0800 03/08/17 0610  NA 129* 124* 128*  K 3.9 4.2 3.9  CL 94* 90* 92*  CO2 25 26 27   GLUCOSE 155* 119* 156*  BUN 69* 73* 62*  CREATININE 2.24* 2.75* 2.14*  CALCIUM 8.0* 8.0* 8.2*  PHOS 1.8*  --  1.7*    Liver Function Tests:  Recent Labs Lab 03/03/17 0526 03/08/17 0610  ALBUMIN 2.3* 2.3*   No results for input(s): LIPASE, AMYLASE in the last 168 hours.  Recent Labs Lab 03/06/17 1227  AMMONIA 44*    CBC:  Recent Labs Lab 03/02/17 1613 03/03/17 0526 03/06/17 0800 03/08/17 0610  WBC 10.7* 8.5 8.6 8.5  HGB 7.8* 7.2* 7.2* 7.9*  HCT 25.4* 23.1* 22.6* 24.9*  MCV 96.6 95.1 93.8 94.0  PLT 172 161 153 166    Cardiac Enzymes: No results for input(s): CKTOTAL, CKMB, CKMBINDEX, TROPONINI in the last 168 hours.  BNP: Invalid input(s): POCBNP  CBG: No results for input(s): GLUCAP in the last 168 hours.  Microbiology: Results for orders placed or performed during the hospital encounter of 02/23/17  Culture, Urine     Status: None   Collection Time: 03/02/17  6:08 PM  Result Value Ref Range Status   Specimen Description URINE, RANDOM  Final   Special Requests NONE  Final   Culture NO GROWTH  Final   Report Status 03/04/2017 FINAL  Final  Culture, respiratory  (NON-Expectorated)     Status: None   Collection Time: 03/03/17  5:05 AM  Result Value Ref Range Status   Specimen Description TRACHEAL ASPIRATE  Final   Special Requests NONE  Final   Gram Stain   Final    MODERATE WBC PRESENT, PREDOMINANTLY PMN RARE SQUAMOUS EPITHELIAL CELLS PRESENT NO ORGANISMS SEEN    Culture Consistent with normal respiratory flora.  Final   Report Status 03/05/2017 FINAL  Final  Culture, blood (routine x 2)     Status: None (Preliminary result)   Collection Time: 03/03/17  5:25 AM  Result Value Ref Range Status   Specimen Description BLOOD LEFT HAND  Final   Special Requests   Final    BOTTLES DRAWN AEROBIC ONLY Blood Culture adequate volume   Culture NO GROWTH 4 DAYS  Final   Report Status PENDING  Incomplete  Culture, blood (routine x 2)     Status: None (Preliminary result)   Collection Time: 03/03/17  5:31 AM  Result Value Ref Range Status   Specimen Description BLOOD RIGHT HAND  Final   Special Requests IN PEDIATRIC BOTTLE Blood Culture adequate volume  Final   Culture NO GROWTH 4 DAYS  Final   Report Status PENDING  Incomplete    Coagulation Studies: No results for input(s): LABPROT, INR in the last 72 hours.  Urinalysis: No results for input(s): COLORURINE,  LABSPEC, PHURINE, GLUCOSEU, HGBUR, BILIRUBINUR, KETONESUR, PROTEINUR, UROBILINOGEN, NITRITE, LEUKOCYTESUR in the last 72 hours.  Invalid input(s): APPERANCEUR    Imaging: No results found.   Medications:       Assessment/ Plan:  83 y.o. male congestive heart failure, chronic kidney disease stage III, atrial fibrillation, diabetes mellitus type 2, coronary artery disease, hypothyroidism, hyperlipidemia, who was admitted to [facility] on 02/23/2017 for ongoing treatment.  Patient originally underwent CABG on 01/11/2017. The patient had decompensation post surgical with a right ventricular failure and was placed on ECMO, taken off of ECMO on 01/18/17. He then developed acute  renal failure and ended up requiring continuous renal replacement therapy and then the patient was transitioned to an intermittent hemodialysis. Patient had tracheostomy placed on 02/08/17 as well as a PEG tube on 02/10/17.   1.  Acute renal failure/chronic kidney disease stage III. The patient has been requiring intermittent hemodialysis.  - we will plan to perform hemodialysis later today. Orders have been prepared.patient will need dialysis on Monday, Wednesday, and Friday.  2. Anemia of chronic kidney disease.  Hemoglobin is up to 7.9 today. Continue to monitor. No urgent indication for transfusion at the moment.  3. Secondary hyperparathyroidism. Phosphorus 1.7 at the moment. Continue to monitor closely.  4. Acute respiratory failure.  Patient remains on the ventilator at this point in time. Weaning efforts as per pulmonary/critical care.   LOS: 0 Yetzali Weld 6/20/20183:23 PM

## 2017-03-10 ENCOUNTER — Other Ambulatory Visit (HOSPITAL_COMMUNITY): Payer: Self-pay

## 2017-03-10 LAB — RENAL FUNCTION PANEL
Albumin: 2.5 g/dL — ABNORMAL LOW (ref 3.5–5.0)
Anion gap: 8 (ref 5–15)
BUN: 58 mg/dL — AB (ref 6–20)
CHLORIDE: 96 mmol/L — AB (ref 101–111)
CO2: 27 mmol/L (ref 22–32)
CREATININE: 1.9 mg/dL — AB (ref 0.61–1.24)
Calcium: 8.6 mg/dL — ABNORMAL LOW (ref 8.9–10.3)
GFR calc Af Amer: 35 mL/min — ABNORMAL LOW (ref >60)
GFR, EST NON AFRICAN AMERICAN: 31 mL/min — AB (ref >60)
Glucose, Bld: 80 mg/dL (ref 65–99)
PHOSPHORUS: 2.3 mg/dL — AB (ref 2.5–4.6)
Potassium: 2.7 mmol/L — CL (ref 3.5–5.1)
Sodium: 131 mmol/L — ABNORMAL LOW (ref 135–145)

## 2017-03-10 LAB — CBC
HEMATOCRIT: 26.2 % — AB (ref 39.0–52.0)
Hemoglobin: 8.2 g/dL — ABNORMAL LOW (ref 13.0–17.0)
MCH: 29.4 pg (ref 26.0–34.0)
MCHC: 31.3 g/dL (ref 30.0–36.0)
MCV: 93.9 fL (ref 78.0–100.0)
PLATELETS: 174 10*3/uL (ref 150–400)
RBC: 2.79 MIL/uL — AB (ref 4.22–5.81)
RDW: 19.4 % — AB (ref 11.5–15.5)
WBC: 9.6 10*3/uL (ref 4.0–10.5)

## 2017-03-10 NOTE — Progress Notes (Signed)
[facility]  ROUNDING NOTE   Subjective:  Patient awake and alert this a.m. He is due for hemodialysis today. He has been transitioned back to aerosolized trach collar.    Objective:  Vital signs in last 24 hours:  Temperature 96.7 pulse 72 respirations 16 blood pressure 157/83   Physical Exam: General: No acute distress  Head: Normocephalic, atraumatic. Moist oral mucosal membranes  Eyes: Anicteric  Neck: Trach in place  Lungs:  Scattered rhonchi, normal effort  Heart: S1S2 no rubs  Abdomen:  Soft, nontender, bowel sounds present  Extremities: 1+ generalized edema  Neurologic: Awake, alert, follows commands  Skin: No lesions       Basic Metabolic Panel:  Recent Labs Lab 03/06/17 0800 03/08/17 0610  NA 124* 128*  K 4.2 3.9  CL 90* 92*  CO2 26 27  GLUCOSE 119* 156*  BUN 73* 62*  CREATININE 2.75* 2.14*  CALCIUM 8.0* 8.2*  PHOS  --  1.7*    Liver Function Tests:  Recent Labs Lab 03/08/17 0610  ALBUMIN 2.3*   No results for input(s): LIPASE, AMYLASE in the last 168 hours.  Recent Labs Lab 03/06/17 1227  AMMONIA 44*    CBC:  Recent Labs Lab 03/06/17 0800 03/08/17 0610  WBC 8.6 8.5  HGB 7.2* 7.9*  HCT 22.6* 24.9*  MCV 93.8 94.0  PLT 153 166    Cardiac Enzymes: No results for input(s): CKTOTAL, CKMB, CKMBINDEX, TROPONINI in the last 168 hours.  BNP: Invalid input(s): POCBNP  CBG: No results for input(s): GLUCAP in the last 168 hours.  Microbiology: Results for orders placed or performed during the hospital encounter of 02/23/17  Culture, Urine     Status: None   Collection Time: 03/02/17  6:08 PM  Result Value Ref Range Status   Specimen Description URINE, RANDOM  Final   Special Requests NONE  Final   Culture NO GROWTH  Final   Report Status 03/04/2017 FINAL  Final  Culture, respiratory (NON-Expectorated)     Status: None   Collection Time: 03/03/17  5:05 AM  Result Value Ref Range Status   Specimen Description  TRACHEAL ASPIRATE  Final   Special Requests NONE  Final   Gram Stain   Final    MODERATE WBC PRESENT, PREDOMINANTLY PMN RARE SQUAMOUS EPITHELIAL CELLS PRESENT NO ORGANISMS SEEN    Culture Consistent with normal respiratory flora.  Final   Report Status 03/05/2017 FINAL  Final  Culture, blood (routine x 2)     Status: None   Collection Time: 03/03/17  5:25 AM  Result Value Ref Range Status   Specimen Description BLOOD LEFT HAND  Final   Special Requests   Final    BOTTLES DRAWN AEROBIC ONLY Blood Culture adequate volume   Culture NO GROWTH 5 DAYS  Final   Report Status 03/08/2017 FINAL  Final  Culture, blood (routine x 2)     Status: None   Collection Time: 03/03/17  5:31 AM  Result Value Ref Range Status   Specimen Description BLOOD RIGHT HAND  Final   Special Requests IN PEDIATRIC BOTTLE Blood Culture adequate volume  Final   Culture NO GROWTH 5 DAYS  Final   Report Status 03/08/2017 FINAL  Final    Coagulation Studies: No results for input(s): LABPROT, INR in the last 72 hours.  Urinalysis: No results for input(s): COLORURINE, LABSPEC, PHURINE, GLUCOSEU, HGBUR, BILIRUBINUR, KETONESUR, PROTEINUR, UROBILINOGEN, NITRITE, LEUKOCYTESUR in the last 72 hours.  Invalid input(s): APPERANCEUR    Imaging:  No results found.   Medications:       Assessment/ Plan:  83 y.o. male congestive heart failure, chronic kidney disease stage III, atrial fibrillation, diabetes mellitus type 2, coronary artery disease, hypothyroidism, hyperlipidemia, who was admitted to [facility] on 02/23/2017 for ongoing treatment.  Patient originally underwent CABG on 01/11/2017. The patient had decompensation post surgical with a right ventricular failure and was placed on ECMO, taken off of ECMO on 01/18/17. He then developed acute renal failure and ended up requiring continuous renal replacement therapy and then the patient was transitioned to an intermittent hemodialysis. Patient had  tracheostomy placed on 02/08/17 as well as a PEG tube on 02/10/17.   1.  Acute renal failure/chronic kidney disease stage III. The patient has been requiring intermittent hemodialysis.  - We will plan for hemodialysis today. Recommend monitoring of BUN and creatinine curve over the weekend. We will plan for dialysis again on Monday.  2. Anemia of chronic kidney disease.  Recommend continue periodic monitoring of CBC.  3. Secondary hyperparathyroidism. Last phosphorus was low 1.7. Repeat this on Monday.  4. Acute respiratory failure.  Patient now transitioned back to aerosolized trach collar. For most of this week he was on the ventilator.   LOS: 0 Jonathan Thompson 6/22/20188:15 AM

## 2017-03-11 LAB — POTASSIUM: POTASSIUM: 3.6 mmol/L (ref 3.5–5.1)

## 2017-03-12 ENCOUNTER — Other Ambulatory Visit (HOSPITAL_COMMUNITY): Payer: Self-pay

## 2017-03-12 LAB — MAGNESIUM: Magnesium: 2.5 mg/dL — ABNORMAL HIGH (ref 1.7–2.4)

## 2017-03-13 ENCOUNTER — Other Ambulatory Visit (HOSPITAL_COMMUNITY): Payer: Self-pay

## 2017-03-13 LAB — RENAL FUNCTION PANEL
Albumin: 2.3 g/dL — ABNORMAL LOW (ref 3.5–5.0)
Anion gap: 8 (ref 5–15)
BUN: 56 mg/dL — AB (ref 6–20)
CALCIUM: 8.2 mg/dL — AB (ref 8.9–10.3)
CO2: 26 mmol/L (ref 22–32)
CREATININE: 2.22 mg/dL — AB (ref 0.61–1.24)
Chloride: 97 mmol/L — ABNORMAL LOW (ref 101–111)
GFR calc Af Amer: 29 mL/min — ABNORMAL LOW (ref >60)
GFR, EST NON AFRICAN AMERICAN: 25 mL/min — AB (ref >60)
GLUCOSE: 116 mg/dL — AB (ref 65–99)
PHOSPHORUS: 3.7 mg/dL (ref 2.5–4.6)
Potassium: 4.2 mmol/L (ref 3.5–5.1)
SODIUM: 131 mmol/L — AB (ref 135–145)

## 2017-03-13 LAB — CBC
HEMATOCRIT: 25.5 % — AB (ref 39.0–52.0)
HEMOGLOBIN: 7.8 g/dL — AB (ref 13.0–17.0)
MCH: 29.5 pg (ref 26.0–34.0)
MCHC: 30.6 g/dL (ref 30.0–36.0)
MCV: 96.6 fL (ref 78.0–100.0)
Platelets: 142 10*3/uL — ABNORMAL LOW (ref 150–400)
RBC: 2.64 MIL/uL — AB (ref 4.22–5.81)
RDW: 19.9 % — ABNORMAL HIGH (ref 11.5–15.5)
WBC: 5.8 10*3/uL (ref 4.0–10.5)

## 2017-03-13 NOTE — Progress Notes (Signed)
[facility]  ROUNDING NOTE   Subjective:  Patient is resting quietly. He is due for hemodialysis today. He has been transitioned back to aerosolized trach collar.    Objective:  Vital signs in last 24 hours:  Temperature 95.5 pulse 90 respirations 20 blood pressure 127/72   Physical Exam: General: No acute distress  Head: Normocephalic, atraumatic. Moist oral mucosal membranes  Eyes: Anicteric  Neck: Trach in place  Lungs:  Scattered rhonchi, normal effort  Heart: S1S2 no rubs  Abdomen:  Soft, nontender, bowel sounds present  Extremities: 1+ generalized edema  Neurologic: Resting quietly.  Skin: B/l feet in soft boot support   Rt IJ PC, Foley in place.    Basic Metabolic Panel:  Recent Labs Lab 03/08/17 0610 03/10/17 0716 03/11/17 1447 03/12/17 1553 03/13/17 0430  NA 128* 131*  --   --  131*  K 3.9 2.7* 3.6  --  4.2  CL 92* 96*  --   --  97*  CO2 27 27  --   --  26  GLUCOSE 156* 80  --   --  116*  BUN 62* 58*  --   --  56*  CREATININE 2.14* 1.90*  --   --  2.22*  CALCIUM 8.2* 8.6*  --   --  8.2*  MG  --   --   --  2.5*  --   PHOS 1.7* 2.3*  --   --  3.7    Liver Function Tests:  Recent Labs Lab 03/08/17 0610 03/10/17 0716 03/13/17 0430  ALBUMIN 2.3* 2.5* 2.3*   No results for input(s): LIPASE, AMYLASE in the last 168 hours. No results for input(s): AMMONIA in the last 168 hours.  CBC:  Recent Labs Lab 03/08/17 0610 03/10/17 0716 03/13/17 0430  WBC 8.5 9.6 5.8  HGB 7.9* 8.2* 7.8*  HCT 24.9* 26.2* 25.5*  MCV 94.0 93.9 96.6  PLT 166 174 142*    Cardiac Enzymes: No results for input(s): CKTOTAL, CKMB, CKMBINDEX, TROPONINI in the last 168 hours.  BNP: Invalid input(s): POCBNP  CBG: No results for input(s): GLUCAP in the last 168 hours.  Microbiology: Results for orders placed or performed during the hospital encounter of 02/23/17  Culture, Urine     Status: None   Collection Time: 03/02/17  6:08 PM  Result Value Ref  Range Status   Specimen Description URINE, RANDOM  Final   Special Requests NONE  Final   Culture NO GROWTH  Final   Report Status 03/04/2017 FINAL  Final  Culture, respiratory (NON-Expectorated)     Status: None   Collection Time: 03/03/17  5:05 AM  Result Value Ref Range Status   Specimen Description TRACHEAL ASPIRATE  Final   Special Requests NONE  Final   Gram Stain   Final    MODERATE WBC PRESENT, PREDOMINANTLY PMN RARE SQUAMOUS EPITHELIAL CELLS PRESENT NO ORGANISMS SEEN    Culture Consistent with normal respiratory flora.  Final   Report Status 03/05/2017 FINAL  Final  Culture, blood (routine x 2)     Status: None   Collection Time: 03/03/17  5:25 AM  Result Value Ref Range Status   Specimen Description BLOOD LEFT HAND  Final   Special Requests   Final    BOTTLES DRAWN AEROBIC ONLY Blood Culture adequate volume   Culture NO GROWTH 5 DAYS  Final   Report Status 03/08/2017 FINAL  Final  Culture, blood (routine x 2)     Status: None  Collection Time: 03/03/17  5:31 AM  Result Value Ref Range Status   Specimen Description BLOOD RIGHT HAND  Final   Special Requests IN PEDIATRIC BOTTLE Blood Culture adequate volume  Final   Culture NO GROWTH 5 DAYS  Final   Report Status 03/08/2017 FINAL  Final    Coagulation Studies: No results for input(s): LABPROT, INR in the last 72 hours.  Urinalysis: No results for input(s): COLORURINE, LABSPEC, PHURINE, GLUCOSEU, HGBUR, BILIRUBINUR, KETONESUR, PROTEINUR, UROBILINOGEN, NITRITE, LEUKOCYTESUR in the last 72 hours.  Invalid input(s): APPERANCEUR    Imaging: Dg Abd Portable 1v  Result Date: 03/13/2017 CLINICAL DATA:  Ileus EXAM: PORTABLE ABDOMEN - 1 VIEW COMPARISON:  Portable exam 0555 hours compared to 03/13/2007 FINDINGS: Persistent mild gas distention of large and small bowel loops consistent with ileus. No bowel wall thickening. Stool present in rectum. Brachytherapy seed implants at prostate bed. Atherosclerotic calcification  aorta. Gastrostomy tube projects over LEFT upper quadrant with numerous EKG leads projecting over upper abdomen. IMPRESSION: Persistent mild gas distention of large and small bowel loops consistent with ileus. Aortic Atherosclerosis (ICD10-I70.0). Electronically Signed   By: Ulyses Southward M.D.   On: 03/13/2017 07:34   Dg Abd Portable 1v  Result Date: 03/12/2017 CLINICAL DATA:  Ileus. EXAM: PORTABLE ABDOMEN - 1 VIEW COMPARISON:  03/10/2017 FINDINGS: There is mild gaseous distention of multiple small and large bowel loops, overall decreased from the prior study. No definite intraperitoneal free air is identified within limitations of supine technique. A gastrostomy tube and prostate brachytherapy seeds are again noted. Sequelae of prior sternotomy and cardiac valve replacement are identified. A pleural effusion is partially visualized in the right lung base. IMPRESSION: Mildly decreased small and large bowel distention compatible with improving ileus. Electronically Signed   By: Sebastian Ache M.D.   On: 03/12/2017 08:54     Medications:       Assessment/ Plan:  83 y.o. male congestive heart failure, chronic kidney disease stage III, atrial fibrillation, diabetes mellitus type 2, coronary artery disease, hypothyroidism, hyperlipidemia, who was admitted to [facility] on 02/23/2017 for ongoing treatment.  Patient originally underwent CABG on 01/11/2017. The patient had decompensation post surgical with a right ventricular failure and was placed on ECMO, taken off of ECMO on 01/18/17. He then developed acute renal failure and ended up requiring continuous renal replacement therapy and then the patient was transitioned to an intermittent hemodialysis. Patient had tracheostomy placed on 02/08/17 as well as a PEG tube on 02/10/17.   1.  Acute renal failure/chronic kidney disease stage III. The patient has been requiring intermittent hemodialysis.  - We will plan for hemodialysis today. - Patient has  been on dialysis for over one month.  - nearing ESRD as no renal recovery - UOP remains poor - next HD on wednesday  2. Anemia of chronic kidney disease.   Recent hgb  7.8  3. Secondary hyperparathyroidism. Phos level 3.7  4. Acute respiratory failure.  Patient now transitioned back to aerosolized trach collar.      LOS: 0 Anup Brigham 6/25/20182:23 PM

## 2017-03-15 LAB — BLOOD GAS, ARTERIAL
Acid-base deficit: 3.1 mmol/L — ABNORMAL HIGH (ref 0.0–2.0)
Bicarbonate: 25.2 mmol/L (ref 20.0–28.0)
FIO2: 40
O2 Saturation: 89.4 %
PH ART: 7.122 — AB (ref 7.350–7.450)
Patient temperature: 98.6
pCO2 arterial: 80.5 mmHg (ref 32.0–48.0)
pO2, Arterial: 67.7 mmHg — ABNORMAL LOW (ref 83.0–108.0)

## 2017-03-15 LAB — RENAL FUNCTION PANEL
ALBUMIN: 2.1 g/dL — AB (ref 3.5–5.0)
Anion gap: 10 (ref 5–15)
BUN: 46 mg/dL — AB (ref 6–20)
CHLORIDE: 96 mmol/L — AB (ref 101–111)
CO2: 25 mmol/L (ref 22–32)
CREATININE: 2.15 mg/dL — AB (ref 0.61–1.24)
Calcium: 8.4 mg/dL — ABNORMAL LOW (ref 8.9–10.3)
GFR, EST AFRICAN AMERICAN: 31 mL/min — AB (ref >60)
GFR, EST NON AFRICAN AMERICAN: 26 mL/min — AB (ref >60)
Glucose, Bld: 123 mg/dL — ABNORMAL HIGH (ref 65–99)
PHOSPHORUS: 3.7 mg/dL (ref 2.5–4.6)
POTASSIUM: 4 mmol/L (ref 3.5–5.1)
Sodium: 131 mmol/L — ABNORMAL LOW (ref 135–145)

## 2017-03-15 LAB — CBC
HCT: 25.6 % — ABNORMAL LOW (ref 39.0–52.0)
Hemoglobin: 7.9 g/dL — ABNORMAL LOW (ref 13.0–17.0)
MCH: 29.4 pg (ref 26.0–34.0)
MCHC: 30.9 g/dL (ref 30.0–36.0)
MCV: 95.2 fL (ref 78.0–100.0)
PLATELETS: 127 10*3/uL — AB (ref 150–400)
RBC: 2.69 MIL/uL — AB (ref 4.22–5.81)
RDW: 19.7 % — AB (ref 11.5–15.5)
WBC: 6.8 10*3/uL (ref 4.0–10.5)

## 2017-03-15 NOTE — Progress Notes (Signed)
[facility]  ROUNDING NOTE   Subjective:  Patient is back on ventilator He underwent HD earlier today.1 L of fluid was removed Trying to mouth words   Objective:  Vital signs in last 24 hours:  Temperature 97.5 pulse 96 respirations 22 blood pressure 124/74   Physical Exam: General: No acute distress  Head: Normocephalic, atraumatic. Moist oral mucosal membranes  Eyes: Anicteric  Neck: Trach in place  Lungs:  Diffuse crackles, vent dependent  Heart: S1S2 no rubs  Abdomen:  Soft, nontender, bowel sounds present  Extremities: 1+ generalized edema  Neurologic: Resting quietly.  Skin: B/l feet in soft boot support   Rt IJ PC,      Basic Metabolic Panel:  Recent Labs Lab 03/10/17 0716 03/11/17 1447 03/12/17 1553 03/13/17 0430 03/15/17 1256  NA 131*  --   --  131* 131*  K 2.7* 3.6  --  4.2 4.0  CL 96*  --   --  97* 96*  CO2 27  --   --  26 25  GLUCOSE 80  --   --  116* 123*  BUN 58*  --   --  56* 46*  CREATININE 1.90*  --   --  2.22* 2.15*  CALCIUM 8.6*  --   --  8.2* 8.4*  MG  --   --  2.5*  --   --   PHOS 2.3*  --   --  3.7 3.7    Liver Function Tests:  Recent Labs Lab 03/10/17 0716 03/13/17 0430 03/15/17 1256  ALBUMIN 2.5* 2.3* 2.1*   No results for input(s): LIPASE, AMYLASE in the last 168 hours. No results for input(s): AMMONIA in the last 168 hours.  CBC:  Recent Labs Lab 03/10/17 0716 03/13/17 0430 03/15/17 0500  WBC 9.6 5.8 6.8  HGB 8.2* 7.8* 7.9*  HCT 26.2* 25.5* 25.6*  MCV 93.9 96.6 95.2  PLT 174 142* 127*    Cardiac Enzymes: No results for input(s): CKTOTAL, CKMB, CKMBINDEX, TROPONINI in the last 168 hours.  BNP: Invalid input(s): POCBNP  CBG: No results for input(s): GLUCAP in the last 168 hours.  Microbiology: Results for orders placed or performed during the hospital encounter of 02/23/17  Culture, Urine     Status: None   Collection Time: 03/02/17  6:08 PM  Result Value Ref Range Status   Specimen  Description URINE, RANDOM  Final   Special Requests NONE  Final   Culture NO GROWTH  Final   Report Status 03/04/2017 FINAL  Final  Culture, respiratory (NON-Expectorated)     Status: None   Collection Time: 03/03/17  5:05 AM  Result Value Ref Range Status   Specimen Description TRACHEAL ASPIRATE  Final   Special Requests NONE  Final   Gram Stain   Final    MODERATE WBC PRESENT, PREDOMINANTLY PMN RARE SQUAMOUS EPITHELIAL CELLS PRESENT NO ORGANISMS SEEN    Culture Consistent with normal respiratory flora.  Final   Report Status 03/05/2017 FINAL  Final  Culture, blood (routine x 2)     Status: None   Collection Time: 03/03/17  5:25 AM  Result Value Ref Range Status   Specimen Description BLOOD LEFT HAND  Final   Special Requests   Final    BOTTLES DRAWN AEROBIC ONLY Blood Culture adequate volume   Culture NO GROWTH 5 DAYS  Final   Report Status 03/08/2017 FINAL  Final  Culture, blood (routine x 2)     Status: None   Collection Time:  03/03/17  5:31 AM  Result Value Ref Range Status   Specimen Description BLOOD RIGHT HAND  Final   Special Requests IN PEDIATRIC BOTTLE Blood Culture adequate volume  Final   Culture NO GROWTH 5 DAYS  Final   Report Status 03/08/2017 FINAL  Final    Coagulation Studies: No results for input(s): LABPROT, INR in the last 72 hours.  Urinalysis: No results for input(s): COLORURINE, LABSPEC, PHURINE, GLUCOSEU, HGBUR, BILIRUBINUR, KETONESUR, PROTEINUR, UROBILINOGEN, NITRITE, LEUKOCYTESUR in the last 72 hours.  Invalid input(s): APPERANCEUR    Imaging: No results found.   Medications:       Assessment/ Plan:  83 y.o. male congestive heart failure, chronic kidney disease stage III, atrial fibrillation, diabetes mellitus type 2, coronary artery disease, hypothyroidism, hyperlipidemia, who was [facility]/2017 for ongoing treatment.  Patient originally underwent CABG on 01/11/2017. The patient had decompensation  post surgical with a right ventricular failure and was placed on ECMO, taken off of ECMO on 01/18/17. He then developed acute renal failure and ended up requiring continuous renal replacement therapy and then the patient was transitioned to an intermittent hemodialysis. Patient had tracheostomy placed on 02/08/17 as well as a PEG tube on 02/10/17.   1.  Acute renal failure/chronic kidney disease stage III. The patient has been requiring intermittent hemodialysis.  - Patient has been on dialysis for over one month.  - Likely ESRD as no renal recovery - UOP remains poor - daily dialysis this week for volume removal  2. Anemia of chronic kidney disease.   Recent hgb  7.9 Aranesp 60 weekly  3. Anasarca - daily dialysis - iv albumin  4. Acute respiratory failure.  Patient now transitioned back to Ventilator    LOS: 0 Collins Kerby 6/27/20184:48 PM

## 2017-03-16 LAB — BLOOD GAS, ARTERIAL
ACID-BASE DEFICIT: 0 mmol/L (ref 0.0–2.0)
BICARBONATE: 24.8 mmol/L (ref 20.0–28.0)
FIO2: 0.3
LHR: 14 {breaths}/min
MECHVT: 500 mL
O2 SAT: 97.6 %
PEEP/CPAP: 5 cmH2O
PH ART: 7.371 (ref 7.350–7.450)
PO2 ART: 98.6 mmHg (ref 83.0–108.0)
Patient temperature: 96.6
pCO2 arterial: 43.2 mmHg (ref 32.0–48.0)

## 2017-03-17 ENCOUNTER — Ambulatory Visit (HOSPITAL_COMMUNITY): Payer: Self-pay

## 2017-03-17 ENCOUNTER — Inpatient Hospital Stay
Admission: AD | Admit: 2017-03-17 | Discharge: 2017-04-11 | Disposition: A | Discharge status: Hospice | Payer: Self-pay | Source: Ambulatory Visit | Attending: Internal Medicine | Admitting: Internal Medicine

## 2017-03-17 ENCOUNTER — Emergency Department (HOSPITAL_COMMUNITY)
Admission: EM | Admit: 2017-03-17 | Discharge: 2017-03-17 | Disposition: A | Discharge status: Home / Self Care | Payer: Medicare HMO | Attending: Emergency Medicine | Admitting: Emergency Medicine

## 2017-03-17 DIAGNOSIS — J9690 Respiratory failure, unspecified, unspecified whether with hypoxia or hypercapnia: Secondary | ICD-10-CM

## 2017-03-17 DIAGNOSIS — R338 Other retention of urine: Secondary | ICD-10-CM

## 2017-03-17 DIAGNOSIS — R339 Retention of urine, unspecified: Secondary | ICD-10-CM | POA: Diagnosis not present

## 2017-03-17 LAB — CBC
HCT: 25.7 % — ABNORMAL LOW (ref 39.0–52.0)
HEMOGLOBIN: 8 g/dL — AB (ref 13.0–17.0)
MCH: 29 pg (ref 26.0–34.0)
MCHC: 31.1 g/dL (ref 30.0–36.0)
MCV: 93.1 fL (ref 78.0–100.0)
PLATELETS: 131 10*3/uL — AB (ref 150–400)
RBC: 2.76 MIL/uL — AB (ref 4.22–5.81)
RDW: 20.1 % — ABNORMAL HIGH (ref 11.5–15.5)
WBC: 7.7 10*3/uL (ref 4.0–10.5)

## 2017-03-17 LAB — RENAL FUNCTION PANEL
ANION GAP: 10 (ref 5–15)
Albumin: 2.2 g/dL — ABNORMAL LOW (ref 3.5–5.0)
BUN: 36 mg/dL — ABNORMAL HIGH (ref 6–20)
CALCIUM: 8 mg/dL — AB (ref 8.9–10.3)
CHLORIDE: 99 mmol/L — AB (ref 101–111)
CO2: 24 mmol/L (ref 22–32)
CREATININE: 1.58 mg/dL — AB (ref 0.61–1.24)
GFR, EST AFRICAN AMERICAN: 44 mL/min — AB (ref >60)
GFR, EST NON AFRICAN AMERICAN: 38 mL/min — AB (ref >60)
Glucose, Bld: 183 mg/dL — ABNORMAL HIGH (ref 65–99)
Phosphorus: 1.7 mg/dL — ABNORMAL LOW (ref 2.5–4.6)
Potassium: 3.4 mmol/L — ABNORMAL LOW (ref 3.5–5.1)
SODIUM: 133 mmol/L — AB (ref 135–145)

## 2017-03-17 NOTE — Progress Notes (Signed)
[facility]  ROUNDING NOTE   Subjective:  Patient is back on ventilator He is resting quietly at the moment 1500 cc removed with HD yesterday Continues to have massive edema   Objective:  Vital signs in last 24 hours:  Temperature 99.6 pulse 111respirations 30 blood pressure 116/75   Physical Exam: General: No acute distress  Head: Normocephalic,  Eyes: Anicteric  Neck: Trach in place  Lungs:  Diffuse crackles, vent dependent  Heart: S1S2 no rubs  Abdomen:  Soft, nontender, bowel sounds present  Extremities: anasarca  Neurologic: Resting quietly.  Skin: B/l feet in soft boot support   Rt IJ PC,      Basic Metabolic Panel:  Recent Labs Lab 03/11/17 1447 03/12/17 1553 03/13/17 0430 03/15/17 1256 03/17/17 0624  NA  --   --  131* 131* 133*  K 3.6  --  4.2 4.0 3.4*  CL  --   --  97* 96* 99*  CO2  --   --  26 25 24   GLUCOSE  --   --  116* 123* 183*  BUN  --   --  56* 46* 36*  CREATININE  --   --  2.22* 2.15* 1.58*  CALCIUM  --   --  8.2* 8.4* 8.0*  MG  --  2.5*  --   --   --   PHOS  --   --  3.7 3.7 1.7*    Liver Function Tests:  Recent Labs Lab 03/13/17 0430 03/15/17 1256 03/17/17 0624  ALBUMIN 2.3* 2.1* 2.2*   No results for input(s): LIPASE, AMYLASE in the last 168 hours. No results for input(s): AMMONIA in the last 168 hours.  CBC:  Recent Labs Lab 03/13/17 0430 03/15/17 0500 03/17/17 0624  WBC 5.8 6.8 7.7  HGB 7.8* 7.9* 8.0*  HCT 25.5* 25.6* 25.7*  MCV 96.6 95.2 93.1  PLT 142* 127* 131*    Cardiac Enzymes: No results for input(s): CKTOTAL, CKMB, CKMBINDEX, TROPONINI in the last 168 hours.  BNP: Invalid input(s): POCBNP  CBG: No results for input(s): GLUCAP in the last 168 hours.  Microbiology: Results for orders placed or performed during the hospital encounter of 02/23/17  Culture, Urine     Status: None   Collection Time: 03/02/17  6:08 PM  Result Value Ref Range Status   Specimen Description URINE, RANDOM  Final    Special Requests NONE  Final   Culture NO GROWTH  Final   Report Status 03/04/2017 FINAL  Final  Culture, respiratory (NON-Expectorated)     Status: None   Collection Time: 03/03/17  5:05 AM  Result Value Ref Range Status   Specimen Description TRACHEAL ASPIRATE  Final   Special Requests NONE  Final   Gram Stain   Final    MODERATE WBC PRESENT, PREDOMINANTLY PMN RARE SQUAMOUS EPITHELIAL CELLS PRESENT NO ORGANISMS SEEN    Culture Consistent with normal respiratory flora.  Final   Report Status 03/05/2017 FINAL  Final  Culture, blood (routine x 2)     Status: None   Collection Time: 03/03/17  5:25 AM  Result Value Ref Range Status   Specimen Description BLOOD LEFT HAND  Final   Special Requests   Final    BOTTLES DRAWN AEROBIC ONLY Blood Culture adequate volume   Culture NO GROWTH 5 DAYS  Final   Report Status 03/08/2017 FINAL  Final  Culture, blood (routine x 2)     Status: None   Collection Time: 03/03/17  5:31 AM  Result Value Ref Range Status   Specimen Description BLOOD RIGHT HAND  Final   Special Requests IN PEDIATRIC BOTTLE Blood Culture adequate volume  Final   Culture NO GROWTH 5 DAYS  Final   Report Status 03/08/2017 FINAL  Final    Coagulation Studies: No results for input(s): LABPROT, INR in the last 72 hours.  Urinalysis: No results for input(s): COLORURINE, LABSPEC, PHURINE, GLUCOSEU, HGBUR, BILIRUBINUR, KETONESUR, PROTEINUR, UROBILINOGEN, NITRITE, LEUKOCYTESUR in the last 72 hours.  Invalid input(s): APPERANCEUR    Imaging: No results found.   Medications:       Assessment/ Plan:  83 y.o. male congestive heart failure, chronic kidney disease stage III, atrial fibrillation, diabetes mellitus type 2, coronary artery disease, hypothyroidism, hyperlipidemia, who was admitted to [facility] on 02/23/2017 for ongoing treatment.  Patient originally underwent CABG on 01/11/2017. The patient had decompensation post surgical with a right  ventricular failure and was placed on ECMO, taken off of ECMO on 01/18/17. He then developed acute renal failure and ended up requiring continuous renal replacement therapy and then the patient was transitioned to an intermittent hemodialysis. Patient had tracheostomy placed on 02/08/17 as well as a PEG tube on 02/10/17.   1.  Likely ESRD as no renal recovery - cause ATN no recovery.   The patient has been requiring intermittent hemodialysis for over one month.   - daily dialysis this week for volume removal - iv albumin for oncotic support  2. Anemia of chronic kidney disease.   Recent hgb 8.0 Aranesp 60 weekly  3. Anasarca - daily dialysis - iv albumin  4. Acute respiratory failure.  Patient now transitioned back to Ventilator    LOS: 0 Carri Spillers 6/29/20189:31 AM

## 2017-03-17 NOTE — ED Notes (Signed)
EDP placed foley catheter.

## 2017-03-17 NOTE — ED Triage Notes (Signed)
[facility]

## 2017-03-17 NOTE — ED Provider Notes (Signed)
[facility]-EMERGENCY DEPT Provider Note   CSN: [id number] Arrival date & time: 03/17/17  1809     History   Chief Complaint No chief complaint on file.   HPI Jonathan KoyanagiBillye E Thompson is a 83 y.o. male.  83 yo M with a chief complaint of acute urinary retention. The patient is currently in a intensive nursing facility. On the vent. Deny fevers.   The history is provided by the nursing home and medical records.  Illness  This is a new problem. The current episode started 3 to 5 hours ago. The problem occurs constantly. The problem has not changed since onset.Pertinent negatives include no chest pain, no abdominal pain, no headaches and no shortness of breath. Nothing aggravates the symptoms. Nothing relieves the symptoms. He has tried nothing for the symptoms. The treatment provided no relief.    No past medical history on file.  There are no active problems to display for this patient.   No past surgical history on file.     Home Medications    Prior to Admission medications   Not on File    Family History No family history on file.  Social History Social History  Substance Use Topics  . Smoking status: Not on file  . Smokeless tobacco: Not on file  . Alcohol use Not on file     Allergies   Patient has no allergy information on record.   Review of Systems Review of Systems  Constitutional: Negative for chills and fever.  HENT: Negative for congestion and facial swelling.   Eyes: Negative for discharge and visual disturbance.  Respiratory: Negative for shortness of breath.   Cardiovascular: Negative for chest pain and palpitations.  Gastrointestinal: Negative for abdominal pain, diarrhea and vomiting.  Genitourinary: Positive for decreased urine volume.  Musculoskeletal: Negative for arthralgias and myalgias.  Skin: Negative for color change and rash.  Neurological: Negative for tremors, syncope and headaches.  Psychiatric/Behavioral: Negative for confusion and  dysphoric mood.     Physical Exam Updated Vital Signs There were no vitals taken for this visit.  Physical Exam  Constitutional: He appears well-developed and well-nourished.  HENT:  Head: Normocephalic and atraumatic.  Trach in place  Eyes: EOM are normal. Pupils are equal, round, and reactive to light.  Neck: Normal range of motion. Neck supple. No JVD present.  Cardiovascular: Normal rate and regular rhythm.  Exam reveals no gallop and no friction rub.   No murmur heard. Pulmonary/Chest: No respiratory distress. He has no wheezes.  Abdominal: He exhibits no distension and no mass. There is no tenderness. There is no rebound and no guarding.  Genitourinary:  Genitourinary Comments: Significant penile and scrotal swelling. Improved with direct pressure. Phimosis unable to visualize the meatus.  Musculoskeletal: Normal range of motion. He exhibits edema (diffuse dependent edema).  Neurological: He is alert.  Skin: No rash noted. No pallor.  Psychiatric: He has a normal mood and affect. His behavior is normal.  Nursing note and vitals reviewed.    ED Treatments / Results  Labs (all labs ordered are listed, but only abnormal results are displayed) Labs Reviewed - No data to display  EKG  EKG Interpretation None       Radiology No results found.  Procedures BLADDER CATHETERIZATION Date/Time: 03/17/2017 6:20 PM Performed by: Adela LankFLOYD, Zhavia Cunanan Authorized by: Melene PlanFLOYD, Lukus Binion   Consent:    Consent obtained:  Verbal   Consent given by:  Healthcare agent   Risks discussed:  False passage, incomplete procedure, infection, pain  and urethral injury   Alternatives discussed:  No treatment Pre-procedure details:    Procedure purpose:  Therapeutic   Preparation: Patient was prepped and draped in usual sterile fashion   Anesthesia (see MAR for exact dosages):    Anesthesia method:  None Procedure details:    Provider performed due to:  Complicated insertion (Significant edema due to  fluid overload to the groin. Unable to visualize the meatus)   Catheter insertion:  Indwelling   Catheter type:  Foley   Catheter size:  14 Fr   Bladder irrigation: no     Number of attempts:  1   Urine characteristics:  Clear Post-procedure details:    Patient tolerance of procedure:  Tolerated well, no immediate complications   (including critical care time)  Medications Ordered in ED Medications - No data to display   Initial Impression / Assessment and Plan / ED Course  I have reviewed the triage vital signs and the nursing notes.  Pertinent labs & imaging results that were available during my care of the patient were reviewed by me and considered in my medical decision making (see chart for details).     82 yo M Sent from an outside facility because they're unable to place a Foley catheter. I think this is secondary to diffuse edema to the penis and scrotum. Placed at bedside. Discharge back to facility.  6:24 PM:  I have discussed the diagnosis/risks/treatment options with the caregiver and believe the pt to be eligible for discharge home to follow-up with Urology. We also discussed returning to the ED immediately if new or worsening sx occur. We discussed the sx which are most concerning (e.g., sudden worsening pain, fever, inability to tolerate by mouth) that necessitate immediate return. Medications administered to the patient during their visit and any new prescriptions provided to the patient are listed below.  Medications given during this visit Medications - No data to display   The patient appears reasonably screen and/or stabilized for discharge and I doubt any other medical condition or other [facility] requiring further screening, evaluation, or treatment in the ED at this time prior to discharge.    Final Clinical Impressions(s) / ED Diagnoses   Final diagnoses:  Acute urinary retention    New Prescriptions New Prescriptions   No medications on file     Melene Plan, DO 03/17/17 1824

## 2017-03-18 LAB — RENAL FUNCTION PANEL
ANION GAP: 8 (ref 5–15)
Albumin: 2 g/dL — ABNORMAL LOW (ref 3.5–5.0)
BUN: 31 mg/dL — ABNORMAL HIGH (ref 6–20)
CHLORIDE: 102 mmol/L (ref 101–111)
CO2: 27 mmol/L (ref 22–32)
CREATININE: 1.43 mg/dL — AB (ref 0.61–1.24)
Calcium: 7.7 mg/dL — ABNORMAL LOW (ref 8.9–10.3)
GFR, EST AFRICAN AMERICAN: 50 mL/min — AB (ref >60)
GFR, EST NON AFRICAN AMERICAN: 43 mL/min — AB (ref >60)
Glucose, Bld: 123 mg/dL — ABNORMAL HIGH (ref 65–99)
POTASSIUM: 3 mmol/L — AB (ref 3.5–5.1)
Phosphorus: 1.7 mg/dL — ABNORMAL LOW (ref 2.5–4.6)
Sodium: 137 mmol/L (ref 135–145)

## 2017-03-18 LAB — URINALYSIS, ROUTINE W REFLEX MICROSCOPIC
Glucose, UA: NEGATIVE mg/dL
Ketones, ur: NEGATIVE mg/dL
Nitrite: NEGATIVE
Protein, ur: 100 mg/dL — AB
SQUAMOUS EPITHELIAL / LPF: NONE SEEN
Specific Gravity, Urine: 1.016 (ref 1.005–1.030)
pH: 7 (ref 5.0–8.0)

## 2017-03-18 LAB — CBC
HEMATOCRIT: 22.4 % — AB (ref 39.0–52.0)
HEMOGLOBIN: 7.2 g/dL — AB (ref 13.0–17.0)
MCH: 29.5 pg (ref 26.0–34.0)
MCHC: 32.1 g/dL (ref 30.0–36.0)
MCV: 91.8 fL (ref 78.0–100.0)
Platelets: 117 10*3/uL — ABNORMAL LOW (ref 150–400)
RBC: 2.44 MIL/uL — AB (ref 4.22–5.81)
RDW: 19.9 % — ABNORMAL HIGH (ref 11.5–15.5)
WBC: 7.1 10*3/uL (ref 4.0–10.5)

## 2017-03-19 LAB — BASIC METABOLIC PANEL
Anion gap: 7 (ref 5–15)
BUN: 55 mg/dL — ABNORMAL HIGH (ref 6–20)
CALCIUM: 7.6 mg/dL — AB (ref 8.9–10.3)
CO2: 25 mmol/L (ref 22–32)
Chloride: 101 mmol/L (ref 101–111)
Creatinine, Ser: 1.9 mg/dL — ABNORMAL HIGH (ref 0.61–1.24)
GFR, EST AFRICAN AMERICAN: 35 mL/min — AB (ref >60)
GFR, EST NON AFRICAN AMERICAN: 31 mL/min — AB (ref >60)
Glucose, Bld: 183 mg/dL — ABNORMAL HIGH (ref 65–99)
Potassium: 3.4 mmol/L — ABNORMAL LOW (ref 3.5–5.1)
Sodium: 133 mmol/L — ABNORMAL LOW (ref 135–145)

## 2017-03-20 LAB — CBC
HCT: 22.5 % — ABNORMAL LOW (ref 39.0–52.0)
HEMOGLOBIN: 7 g/dL — AB (ref 13.0–17.0)
MCH: 29.3 pg (ref 26.0–34.0)
MCHC: 31.1 g/dL (ref 30.0–36.0)
MCV: 94.1 fL (ref 78.0–100.0)
Platelets: 107 10*3/uL — ABNORMAL LOW (ref 150–400)
RBC: 2.39 MIL/uL — ABNORMAL LOW (ref 4.22–5.81)
RDW: 20.1 % — ABNORMAL HIGH (ref 11.5–15.5)
WBC: 7 10*3/uL (ref 4.0–10.5)

## 2017-03-20 LAB — URINE CULTURE

## 2017-03-20 LAB — RENAL FUNCTION PANEL
ANION GAP: 9 (ref 5–15)
Albumin: 2.1 g/dL — ABNORMAL LOW (ref 3.5–5.0)
BUN: 38 mg/dL — ABNORMAL HIGH (ref 6–20)
CO2: 28 mmol/L (ref 22–32)
Calcium: 7.7 mg/dL — ABNORMAL LOW (ref 8.9–10.3)
Chloride: 96 mmol/L — ABNORMAL LOW (ref 101–111)
Creatinine, Ser: 1.23 mg/dL (ref 0.61–1.24)
GFR calc non Af Amer: 52 mL/min — ABNORMAL LOW (ref >60)
GFR, EST AFRICAN AMERICAN: 60 mL/min — AB (ref >60)
GLUCOSE: 168 mg/dL — AB (ref 65–99)
POTASSIUM: 3.5 mmol/L (ref 3.5–5.1)
Phosphorus: 1.7 mg/dL — ABNORMAL LOW (ref 2.5–4.6)
Sodium: 133 mmol/L — ABNORMAL LOW (ref 135–145)

## 2017-03-20 NOTE — Progress Notes (Signed)
[facility]  ROUNDING NOTE   Subjective:  Patient was seen during HD. He is doing fair Remains on ventilator Had urinary retention. Foley had to be placed Continues to have anasarca   Objective:  Vital signs in last 24 hours:  Temperature  97.7  P 89  RR 16  BP 103/64 Vent: Fio2 28%; PEEP 5   Physical Exam: General: No acute distress  Head: Normocephalic,  Eyes: Anicteric  Neck: Trach in place  Lungs:  Diffuse crackles, vent dependent  Heart: S1S2 no rubs  Abdomen:  Soft, nontender, bowel sounds present  Extremities: anasarca  Neurologic: Resting quietly.  Skin: B/l feet in soft boot support   Rt IJ PC,      Basic Metabolic Panel:  Recent Labs Lab 03/15/17 1256 03/17/17 0624 03/18/17 0420 03/19/17 0532 03/20/17 1250  NA 131* 133* 137 133* 133*  K 4.0 3.4* 3.0* 3.4* 3.5  CL 96* 99* 102 101 96*  CO2 25 24 27 25 28   GLUCOSE 123* 183* 123* 183* 168*  BUN 46* 36* 31* 55* 38*  CREATININE 2.15* 1.58* 1.43* 1.90* 1.23  CALCIUM 8.4* 8.0* 7.7* 7.6* 7.7*  PHOS 3.7 1.7* 1.7*  --  1.7*    Liver Function Tests:  Recent Labs Lab 03/15/17 1256 03/17/17 0624 03/18/17 0420 03/20/17 1250  ALBUMIN 2.1* 2.2* 2.0* 2.1*   No results for input(s): LIPASE, AMYLASE in the last 168 hours. No results for input(s): AMMONIA in the last 168 hours.  CBC:  Recent Labs Lab 03/15/17 0500 03/17/17 0624 03/18/17 0420 03/20/17 1250  WBC 6.8 7.7 7.1 7.0  HGB 7.9* 8.0* 7.2* 7.0*  HCT 25.6* 25.7* 22.4* 22.5*  MCV 95.2 93.1 91.8 94.1  PLT 127* 131* 117* 107*    Cardiac Enzymes: No results for input(s): CKTOTAL, CKMB, CKMBINDEX, TROPONINI in the last 168 hours.  BNP: Invalid input(s): POCBNP  CBG: No results for input(s): GLUCAP in the last 168 hours.  Microbiology: Results for orders placed or performed during the hospital encounter of 03/17/17  Culture, Urine     Status: Abnormal   Collection Time: 03/18/17 11:25 AM  Result Value Ref Range Status   Specimen Description URINE, RANDOM  Final   Special Requests NONE  Final   Culture >=100,000 COLONIES/mL PROTEUS MIRABILIS (A)  Final   Report Status 03/20/2017 FINAL  Final   Organism ID, Bacteria PROTEUS MIRABILIS (A)  Final      Susceptibility   Proteus mirabilis - MIC*    AMPICILLIN >=32 RESISTANT Resistant     CEFAZOLIN 8 SENSITIVE Sensitive     CEFTRIAXONE <=1 SENSITIVE Sensitive     CIPROFLOXACIN <=0.25 SENSITIVE Sensitive     GENTAMICIN >=16 RESISTANT Resistant     IMIPENEM 4 SENSITIVE Sensitive     NITROFURANTOIN 128 RESISTANT Resistant     TRIMETH/SULFA <=20 SENSITIVE Sensitive     AMPICILLIN/SULBACTAM >=32 RESISTANT Resistant     PIP/TAZO <=4 SENSITIVE Sensitive     * >=100,000 COLONIES/mL PROTEUS MIRABILIS    Coagulation Studies: No results for input(s): LABPROT, INR in the last 72 hours.  Urinalysis:  Recent Labs  03/18/17 1125  COLORURINE AMBER*  LABSPEC 1.016  PHURINE 7.0  GLUCOSEU NEGATIVE  HGBUR MODERATE*  BILIRUBINUR SMALL*  KETONESUR NEGATIVE  PROTEINUR 100*  NITRITE NEGATIVE  LEUKOCYTESUR LARGE*      Imaging: No results found.   Medications:       Assessment/ Plan:  83 y.o. male congestive heart failure, chronic kidney disease stage III, atrial  fibrillation, diabetes mellitus type 2, coronary artery disease, hypothyroidism, hyperlipidemia, who was [facility] for ongoing treatment.  Patient originally underwent CABG on 01/11/2017. The patient had decompensation post surgical with a right ventricular failure and was placed on ECMO, taken off of ECMO on 01/18/17. He then developed acute renal failure and ended up requiring continuous renal replacement therapy and then the patient was transitioned to an intermittent hemodialysis. Patient had tracheostomy placed on 02/08/17 as well as a PEG tube on 02/10/17.   1.  ARF-  ATN  The patient has been requiring intermittent hemodialysis for over one month.   - daily  dialysis last week for volume removal.  - iv albumin for oncotic support Fri -1800 Thu -1400 Wed -1050  Hold dialysis to evaluate for renal recovery. Monitor UOP closely  2. Anemia of chronic kidney disease.   Recent hgb 7.0 Increase from Aranesp 60 weekly to 100 weekly  3. Anasarca - start iv lasix 80 BID - iv albumin  4. Acute respiratory failure.  Patient now transitioned back to Ventilator  5. Acute urinary retention Now has foley  6. Proteus UTI Abx as per primary team    LOS: 0 Skylynne Schlechter 7/2/20183:46 PM

## 2017-03-22 LAB — BASIC METABOLIC PANEL
Anion gap: 8 (ref 5–15)
BUN: 57 mg/dL — ABNORMAL HIGH (ref 6–20)
CHLORIDE: 96 mmol/L — AB (ref 101–111)
CO2: 27 mmol/L (ref 22–32)
CREATININE: 1.74 mg/dL — AB (ref 0.61–1.24)
Calcium: 8 mg/dL — ABNORMAL LOW (ref 8.9–10.3)
GFR calc non Af Amer: 34 mL/min — ABNORMAL LOW (ref >60)
GFR, EST AFRICAN AMERICAN: 39 mL/min — AB (ref >60)
Glucose, Bld: 185 mg/dL — ABNORMAL HIGH (ref 65–99)
Potassium: 3.8 mmol/L (ref 3.5–5.1)
Sodium: 131 mmol/L — ABNORMAL LOW (ref 135–145)

## 2017-03-22 LAB — CBC
HEMATOCRIT: 22.4 % — AB (ref 39.0–52.0)
HEMOGLOBIN: 7 g/dL — AB (ref 13.0–17.0)
MCH: 29.3 pg (ref 26.0–34.0)
MCHC: 31.3 g/dL (ref 30.0–36.0)
MCV: 93.7 fL (ref 78.0–100.0)
Platelets: 147 10*3/uL — ABNORMAL LOW (ref 150–400)
RBC: 2.39 MIL/uL — ABNORMAL LOW (ref 4.22–5.81)
RDW: 19.3 % — ABNORMAL HIGH (ref 11.5–15.5)
WBC: 8.6 10*3/uL (ref 4.0–10.5)

## 2017-03-22 LAB — CK: Total CK: 15 U/L — ABNORMAL LOW (ref 49–397)

## 2017-03-22 NOTE — Progress Notes (Signed)
[facility]  ROUNDING NOTE   Subjective:  Patient is doing fair Remains on ventilator Had urinary retention. Foley had to be placed Continues to have anasarca UOP 575 cc   Objective:  Vital signs in last 24 hours:  Temperature  97.6  P 95  RR 24  BP 107/63 Vent: Fio2 28%; PEEP 5   Physical Exam: General: No acute distress  Head: Normocephalic,  Eyes: Anicteric  Neck: Trach in place  Lungs:  Diffuse crackles, vent dependent  Heart: S1S2 no rubs  Abdomen:  Soft, nontender,    Extremities: anasarca  Neurologic: Resting quietly.  Skin: B/l feet in soft boot support   Rt IJ PC,      Basic Metabolic Panel:  Recent Labs Lab 03/15/17 1256 03/17/17 0624 03/18/17 0420 03/19/17 0532 03/20/17 1250 03/22/17 0619  NA 131* 133* 137 133* 133* 131*  K 4.0 3.4* 3.0* 3.4* 3.5 3.8  CL 96* 99* 102 101 96* 96*  CO2 25 24 27 25 28 27   GLUCOSE 123* 183* 123* 183* 168* 185*  BUN 46* 36* 31* 55* 38* 57*  CREATININE 2.15* 1.58* 1.43* 1.90* 1.23 1.74*  CALCIUM 8.4* 8.0* 7.7* 7.6* 7.7* 8.0*  PHOS 3.7 1.7* 1.7*  --  1.7*  --     Liver Function Tests:  Recent Labs Lab 03/15/17 1256 03/17/17 0624 03/18/17 0420 03/20/17 1250  ALBUMIN 2.1* 2.2* 2.0* 2.1*   No results for input(s): LIPASE, AMYLASE in the last 168 hours. No results for input(s): AMMONIA in the last 168 hours.  CBC:  Recent Labs Lab 03/17/17 0624 03/18/17 0420 03/20/17 1250 03/22/17 0619  WBC 7.7 7.1 7.0 8.6  HGB 8.0* 7.2* 7.0* 7.0*  HCT 25.7* 22.4* 22.5* 22.4*  MCV 93.1 91.8 94.1 93.7  PLT 131* 117* 107* 147*    Cardiac Enzymes: No results for input(s): CKTOTAL, CKMB, CKMBINDEX, TROPONINI in the last 168 hours.  BNP: Invalid input(s): POCBNP  CBG: No results for input(s): GLUCAP in the last 168 hours.  Microbiology: Results for orders placed or performed during the hospital encounter of 03/17/17  Culture, Urine     Status: Abnormal   Collection Time: 03/18/17 11:25 AM  Result  Value Ref Range Status   Specimen Description URINE, RANDOM  Final   Special Requests NONE  Final   Culture >=100,000 COLONIES/mL PROTEUS MIRABILIS (A)  Final   Report Status 03/20/2017 FINAL  Final   Organism ID, Bacteria PROTEUS MIRABILIS (A)  Final      Susceptibility   Proteus mirabilis - MIC*    AMPICILLIN >=32 RESISTANT Resistant     CEFAZOLIN 8 SENSITIVE Sensitive     CEFTRIAXONE <=1 SENSITIVE Sensitive     CIPROFLOXACIN <=0.25 SENSITIVE Sensitive     GENTAMICIN >=16 RESISTANT Resistant     IMIPENEM 4 SENSITIVE Sensitive     NITROFURANTOIN 128 RESISTANT Resistant     TRIMETH/SULFA <=20 SENSITIVE Sensitive     AMPICILLIN/SULBACTAM >=32 RESISTANT Resistant     PIP/TAZO <=4 SENSITIVE Sensitive     * >=100,000 COLONIES/mL PROTEUS MIRABILIS    Coagulation Studies: No results for input(s): LABPROT, INR in the last 72 hours.  Urinalysis: No results for input(s): COLORURINE, LABSPEC, PHURINE, GLUCOSEU, HGBUR, BILIRUBINUR, KETONESUR, PROTEINUR, UROBILINOGEN, NITRITE, LEUKOCYTESUR in the last 72 hours.  Invalid input(s): APPERANCEUR    Imaging: No results found.   Medications:       Assessment/ Plan:  83 y.o. male congestive heart failure, chronic kidney disease stage III, atrial fibrillation, diabetes mellitus  type 2, coronary artery disease, hypothyroidism, hyperlipidemia, who was [facility]/2017 for ongoing treatment.  Patient originally underwent CABG on 01/11/2017. The patient had decompensation post surgical with a right ventricular failure and was placed on ECMO, taken off of ECMO on 01/18/17. He then developed acute renal failure and ended up requiring continuous renal replacement therapy and then the patient was transitioned to an intermittent hemodialysis. Patient had tracheostomy placed on 02/08/17 as well as a PEG tube on 02/10/17.   1.  ARF-  ATN  The patient has been requiring intermittent hemodialysis for over one month.   -  daily dialysis last week for volume removal.  - iv albumin for oncotic support Fri -1800 Thu -1400 Wed -1050  Hold dialysis to evaluate for renal recovery. Monitor UOP and BMP closely  2. Anemia of chronic kidney disease.   Recent hgb 7.0 Increase from Aranesp 60 weekly to 100 weekly  3. Anasarca - continue iv lasix 80 BID - iv albumin  4. Acute respiratory failure.  Patient now transitioned back to Ventilator  5. Acute urinary retention Now has foley  6. Proteus UTI Abx as per primary team    LOS: 0 Chelesea Weiand 7/4/20189:02 AM

## 2017-03-23 ENCOUNTER — Other Ambulatory Visit (HOSPITAL_COMMUNITY): Payer: Self-pay

## 2017-03-23 LAB — BASIC METABOLIC PANEL
Anion gap: 9 (ref 5–15)
BUN: 75 mg/dL — ABNORMAL HIGH (ref 6–20)
CALCIUM: 8.3 mg/dL — AB (ref 8.9–10.3)
CO2: 29 mmol/L (ref 22–32)
CREATININE: 2 mg/dL — AB (ref 0.61–1.24)
Chloride: 95 mmol/L — ABNORMAL LOW (ref 101–111)
GFR calc Af Amer: 33 mL/min — ABNORMAL LOW (ref >60)
GFR calc non Af Amer: 29 mL/min — ABNORMAL LOW (ref >60)
Glucose, Bld: 166 mg/dL — ABNORMAL HIGH (ref 65–99)
Potassium: 3.9 mmol/L (ref 3.5–5.1)
Sodium: 133 mmol/L — ABNORMAL LOW (ref 135–145)

## 2017-03-24 ENCOUNTER — Other Ambulatory Visit (HOSPITAL_COMMUNITY): Payer: Self-pay

## 2017-03-24 ENCOUNTER — Ambulatory Visit (HOSPITAL_COMMUNITY): Payer: Self-pay

## 2017-03-24 LAB — BASIC METABOLIC PANEL
Anion gap: 11 (ref 5–15)
BUN: 90 mg/dL — AB (ref 6–20)
CHLORIDE: 94 mmol/L — AB (ref 101–111)
CO2: 26 mmol/L (ref 22–32)
CREATININE: 2.19 mg/dL — AB (ref 0.61–1.24)
Calcium: 8.4 mg/dL — ABNORMAL LOW (ref 8.9–10.3)
GFR calc Af Amer: 30 mL/min — ABNORMAL LOW (ref >60)
GFR calc non Af Amer: 26 mL/min — ABNORMAL LOW (ref >60)
GLUCOSE: 181 mg/dL — AB (ref 65–99)
Potassium: 3.8 mmol/L (ref 3.5–5.1)
SODIUM: 131 mmol/L — AB (ref 135–145)

## 2017-03-24 NOTE — Progress Notes (Signed)
[facility]  ROUNDING NOTE   Subjective:  Patient is doing fair Remains on ventilator. His daughter, Okey RegalCarol, is at bedside Had urinary retention. Foley had to be placed Continues to have anasarca UOP 500 cc since this morning (8 hours)    Objective:  Vital signs in last 24 hours:  Temperature  92.8  P 74  RR 15  BP 141/81 Vent: Fio2 28%; PEEP 5   Physical Exam: General: No acute distress  Head: Normocephalic,  Eyes: Anicteric  Neck: Trach in place  Lungs:  Mild Diffuse crackles, vent dependent  Heart: S1S2 no rubs  Abdomen:  Soft, nontender,    Extremities: anasarca  Neurologic: Resting quietly. Able to follow simple commands  Skin: B/l feet in soft boot support   Rt IJ PC,      Basic Metabolic Panel:  Recent Labs Lab 03/18/17 0420 03/19/17 0532 03/20/17 1250 03/22/17 0619 03/23/17 0623 03/24/17 0532  NA 137 133* 133* 131* 133* 131*  K 3.0* 3.4* 3.5 3.8 3.9 3.8  CL 102 101 96* 96* 95* 94*  CO2 27 25 28 27 29 26   GLUCOSE 123* 183* 168* 185* 166* 181*  BUN 31* 55* 38* 57* 75* 90*  CREATININE 1.43* 1.90* 1.23 1.74* 2.00* 2.19*  CALCIUM 7.7* 7.6* 7.7* 8.0* 8.3* 8.4*  PHOS 1.7*  --  1.7*  --   --   --     Liver Function Tests:  Recent Labs Lab 03/18/17 0420 03/20/17 1250  ALBUMIN 2.0* 2.1*   No results for input(s): LIPASE, AMYLASE in the last 168 hours. No results for input(s): AMMONIA in the last 168 hours.  CBC:  Recent Labs Lab 03/18/17 0420 03/20/17 1250 03/22/17 0619  WBC 7.1 7.0 8.6  HGB 7.2* 7.0* 7.0*  HCT 22.4* 22.5* 22.4*  MCV 91.8 94.1 93.7  PLT 117* 107* 147*    Cardiac Enzymes:  Recent Labs Lab 03/22/17 1418  CKTOTAL 15*    BNP: Invalid input(s): POCBNP  CBG: No results for input(s): GLUCAP in the last 168 hours.  Microbiology: Results for orders placed or performed during the hospital encounter of 03/17/17  Culture, Urine     Status: Abnormal   Collection Time: 03/18/17 11:25 AM  Result Value Ref  Range Status   Specimen Description URINE, RANDOM  Final   Special Requests NONE  Final   Culture >=100,000 COLONIES/mL PROTEUS MIRABILIS (A)  Final   Report Status 03/20/2017 FINAL  Final   Organism ID, Bacteria PROTEUS MIRABILIS (A)  Final      Susceptibility   Proteus mirabilis - MIC*    AMPICILLIN >=32 RESISTANT Resistant     CEFAZOLIN 8 SENSITIVE Sensitive     CEFTRIAXONE <=1 SENSITIVE Sensitive     CIPROFLOXACIN <=0.25 SENSITIVE Sensitive     GENTAMICIN >=16 RESISTANT Resistant     IMIPENEM 4 SENSITIVE Sensitive     NITROFURANTOIN 128 RESISTANT Resistant     TRIMETH/SULFA <=20 SENSITIVE Sensitive     AMPICILLIN/SULBACTAM >=32 RESISTANT Resistant     PIP/TAZO <=4 SENSITIVE Sensitive     * >=100,000 COLONIES/mL PROTEUS MIRABILIS    Coagulation Studies: No results for input(s): LABPROT, INR in the last 72 hours.  Urinalysis: No results for input(s): COLORURINE, LABSPEC, PHURINE, GLUCOSEU, HGBUR, BILIRUBINUR, KETONESUR, PROTEINUR, UROBILINOGEN, NITRITE, LEUKOCYTESUR in the last 72 hours.  Invalid input(s): APPERANCEUR    Imaging: Dg Chest Port 1 View  Result Date: 03/24/2017 CLINICAL DATA:  Respiratory failure EXAM: PORTABLE CHEST 1 VIEW COMPARISON:  March 23, 2017 FINDINGS: Tracheostomy tube is unchanged. A right central venous line is unchanged. Patient status post prior median sternotomy CABG and cardiac valvular replacement. There is pulmonary edema. Patchy consolidation of bilateral lung bases with bilateral pleural effusions are unchanged. The heart size is enlarged. IMPRESSION: Stable appearance compared to prior exam. Bibasilar opacities (atelectasis versus pneumonia )with bilateral pleural effusions and pulmonary edema unchanged. Electronically Signed   By: Sherian Rein M.D.   On: 03/24/2017 07:35   Dg Chest Port 1 View  Result Date: 03/23/2017 CLINICAL DATA:  Respiratory failure EXAM: PORTABLE CHEST 1 VIEW COMPARISON:  03/03/2017 FINDINGS: Chronic cardiomegaly. Status  post mitral valve replacement and CABG. Perma catheter on the right with tip at the upper cavoatrial junction. Tracheostomy tube present. Unchanged streaky opacities in the bilateral low volume lungs. Probable small pleural effusions. Stable patchy ground-glass density greater on the right. No pneumothorax. IMPRESSION: 1. Stable compared to 03/03/2017. 2. Low volume chest with bilateral atelectasis or scarring. Superimposed pneumonia could be obscured. Electronically Signed   By: Marnee Spring M.D.   On: 03/23/2017 12:19     Medications:       Assessment/ Plan:  83 y.o. male congestive heart failure, chronic kidney disease stage III, atrial fibrillation, diabetes mellitus type 2, coronary artery disease, hypothyroidism, hyperlipidemia, who was admitted to [facility] on 02/23/2017 for ongoing treatment.  Patient originally underwent CABG on 01/11/2017. The patient had decompensation post surgical with a right ventricular failure and was placed on ECMO, taken off of ECMO on 01/18/17. He then developed acute renal failure and ended up requiring continuous renal replacement therapy and then the patient was transitioned to an intermittent hemodialysis. Patient had tracheostomy placed on 02/08/17 as well as a PEG tube on 02/10/17.   1.  ARF-  ATN  The patient has been requiring intermittent hemodialysis for over one month.  -   daily dialysis last week for volume removal.  S Creatinine is low normal. UOP Approx 500 cc today with iv albumin (predicted 1500 cc for 24 hrs)  Continue iv albumin and iv lasix. Increase dose of albumin to 3 times per day Hold dialysis to evaluate for renal recovery.  2. Anemia of chronic kidney disease.   Recent hgb 7.0 Continue Aranesp 100 weekly  3. Anasarca - continue iv lasix 80 BID - iv albumin  4. Acute respiratory failure.  Patient now transitioned back to Ventilator  5. Acute urinary retention Now has foley  6. Proteus UTI Abx as per  primary team    LOS: 0 Kade Demicco 7/6/20183:15 PM

## 2017-03-25 LAB — RENAL FUNCTION PANEL
Albumin: 2.6 g/dL — ABNORMAL LOW (ref 3.5–5.0)
Anion gap: 11 (ref 5–15)
BUN: 106 mg/dL — AB (ref 6–20)
CHLORIDE: 92 mmol/L — AB (ref 101–111)
CO2: 27 mmol/L (ref 22–32)
CREATININE: 2.37 mg/dL — AB (ref 0.61–1.24)
Calcium: 8.6 mg/dL — ABNORMAL LOW (ref 8.9–10.3)
GFR, EST AFRICAN AMERICAN: 27 mL/min — AB (ref >60)
GFR, EST NON AFRICAN AMERICAN: 23 mL/min — AB (ref >60)
Glucose, Bld: 171 mg/dL — ABNORMAL HIGH (ref 65–99)
Phosphorus: 4.2 mg/dL (ref 2.5–4.6)
Potassium: 3.9 mmol/L (ref 3.5–5.1)
Sodium: 130 mmol/L — ABNORMAL LOW (ref 135–145)

## 2017-03-27 ENCOUNTER — Encounter (HOSPITAL_COMMUNITY): Payer: Self-pay

## 2017-03-27 LAB — CBC
HCT: 20.7 % — ABNORMAL LOW (ref 39.0–52.0)
Hemoglobin: 6.5 g/dL — CL (ref 13.0–17.0)
MCH: 29.5 pg (ref 26.0–34.0)
MCHC: 31.4 g/dL (ref 30.0–36.0)
MCV: 94.1 fL (ref 78.0–100.0)
PLATELETS: 214 10*3/uL (ref 150–400)
RBC: 2.2 MIL/uL — ABNORMAL LOW (ref 4.22–5.81)
RDW: 18.9 % — AB (ref 11.5–15.5)
WBC: 5.8 10*3/uL (ref 4.0–10.5)

## 2017-03-27 LAB — BASIC METABOLIC PANEL
Anion gap: 16 — ABNORMAL HIGH (ref 5–15)
BUN: 145 mg/dL — AB (ref 6–20)
CHLORIDE: 91 mmol/L — AB (ref 101–111)
CO2: 23 mmol/L (ref 22–32)
CREATININE: 2.96 mg/dL — AB (ref 0.61–1.24)
Calcium: 8.7 mg/dL — ABNORMAL LOW (ref 8.9–10.3)
GFR calc Af Amer: 21 mL/min — ABNORMAL LOW (ref >60)
GFR calc non Af Amer: 18 mL/min — ABNORMAL LOW (ref >60)
Glucose, Bld: 217 mg/dL — ABNORMAL HIGH (ref 65–99)
Potassium: 4 mmol/L (ref 3.5–5.1)
Sodium: 130 mmol/L — ABNORMAL LOW (ref 135–145)

## 2017-03-27 LAB — RENAL FUNCTION PANEL
Albumin: 3 g/dL — ABNORMAL LOW (ref 3.5–5.0)
Anion gap: 15 (ref 5–15)
BUN: 143 mg/dL — AB (ref 6–20)
CHLORIDE: 91 mmol/L — AB (ref 101–111)
CO2: 24 mmol/L (ref 22–32)
CREATININE: 2.94 mg/dL — AB (ref 0.61–1.24)
Calcium: 8.6 mg/dL — ABNORMAL LOW (ref 8.9–10.3)
GFR calc Af Amer: 21 mL/min — ABNORMAL LOW (ref >60)
GFR, EST NON AFRICAN AMERICAN: 18 mL/min — AB (ref >60)
Glucose, Bld: 218 mg/dL — ABNORMAL HIGH (ref 65–99)
Phosphorus: 6.3 mg/dL — ABNORMAL HIGH (ref 2.5–4.6)
Potassium: 3.9 mmol/L (ref 3.5–5.1)
Sodium: 130 mmol/L — ABNORMAL LOW (ref 135–145)

## 2017-03-27 LAB — HEPATITIS B SURFACE ANTIGEN: HEP B S AG: NEGATIVE

## 2017-03-27 LAB — PREPARE RBC (CROSSMATCH)

## 2017-03-27 LAB — ABO/RH: ABO/RH(D): A POS

## 2017-03-27 NOTE — Progress Notes (Signed)
[facility]  ROUNDING NOTE   Subjective:  Renal function appears to be deteriorating. BUN up to 143 with a creatinine of 2.94. Case discussed with hospitalist. We are considering restarting dialysis.  Objective:  Vital signs in last 24 hours:  Pulse 70 respirations 20 blood pressure 96/68   Physical Exam: General: No acute distress  Head: Normocephalic  Eyes: Anicteric  Neck: Trach in place  Lungs:  Bilateral rales, vent dependent  Heart: S1S2 no rubs  Abdomen:  Soft, nontender  Extremities: anasarca  Neurologic: Resting quietly. Able to follow simple commands  Skin: B/l feet in soft boot support   Rt IJ PC    Basic Metabolic Panel:  Recent Labs Lab 03/22/17 0619 03/23/17 0623 03/24/17 0532 03/25/17 0613 03/27/17 0724  NA 131* 133* 131* 130* 130*  130*  K 3.8 3.9 3.8 3.9 4.0  3.9  CL 96* 95* 94* 92* 91*  91*  CO2 27 29 26 27 23  24   GLUCOSE 185* 166* 181* 171* 217*  218*  BUN 57* 75* 90* 106* 145*  143*  CREATININE 1.74* 2.00* 2.19* 2.37* 2.96*  2.94*  CALCIUM 8.0* 8.3* 8.4* 8.6* 8.7*  8.6*  PHOS  --   --   --  4.2 6.3*    Liver Function Tests:  Recent Labs Lab 03/25/17 0613 03/27/17 0724  ALBUMIN 2.6* 3.0*   No results for input(s): LIPASE, AMYLASE in the last 168 hours. No results for input(s): AMMONIA in the last 168 hours.  CBC:  Recent Labs Lab 03/22/17 0619 03/27/17 0724  WBC 8.6 5.8  HGB 7.0* 6.5*  HCT 22.4* 20.7*  MCV 93.7 94.1  PLT 147* 214    Cardiac Enzymes:  Recent Labs Lab 03/22/17 1418  CKTOTAL 15*    BNP: Invalid input(s): POCBNP  CBG: No results for input(s): GLUCAP in the last 168 hours.  Microbiology: Results for orders placed or performed during the hospital encounter of 03/17/17  Culture, Urine     Status: Abnormal   Collection Time: 03/18/17 11:25 AM  Result Value Ref Range Status   Specimen Description URINE, RANDOM  Final   Special Requests NONE  Final   Culture >=100,000  COLONIES/mL PROTEUS MIRABILIS (A)  Final   Report Status 03/20/2017 FINAL  Final   Organism ID, Bacteria PROTEUS MIRABILIS (A)  Final      Susceptibility   Proteus mirabilis - MIC*    AMPICILLIN >=32 RESISTANT Resistant     CEFAZOLIN 8 SENSITIVE Sensitive     CEFTRIAXONE <=1 SENSITIVE Sensitive     CIPROFLOXACIN <=0.25 SENSITIVE Sensitive     GENTAMICIN >=16 RESISTANT Resistant     IMIPENEM 4 SENSITIVE Sensitive     NITROFURANTOIN 128 RESISTANT Resistant     TRIMETH/SULFA <=20 SENSITIVE Sensitive     AMPICILLIN/SULBACTAM >=32 RESISTANT Resistant     PIP/TAZO <=4 SENSITIVE Sensitive     * >=100,000 COLONIES/mL PROTEUS MIRABILIS    Coagulation Studies: No results for input(s): LABPROT, INR in the last 72 hours.  Urinalysis: No results for input(s): COLORURINE, LABSPEC, PHURINE, GLUCOSEU, HGBUR, BILIRUBINUR, KETONESUR, PROTEINUR, UROBILINOGEN, NITRITE, LEUKOCYTESUR in the last 72 hours.  Invalid input(s): APPERANCEUR    Imaging: No results found.   Medications:       Assessment/ Plan:  83 y.o. male congestive heart failure, chronic kidney disease stage III, atrial fibrillation, diabetes mellitus type 2, coronary artery disease, hypothyroidism, hyperlipidemia, who was [facility] for ongoing treatment.  Patient originally underwent  CABG on 01/11/2017. The patient had decompensation post surgical with a right ventricular failure and was placed on ECMO, taken off of ECMO on 01/18/17. He then developed acute renal failure and ended up requiring continuous renal replacement therapy and then the patient was transitioned to an intermittent hemodialysis. Patient had tracheostomy placed on 02/08/17 as well as a PEG tube on 02/10/17.   1.  ARF-  ATN  The patient has been requiring intermittent hemodialysis for over one month.  -  BUN starting to rise again off of dialysis. It appears at the moment he remains dialysis dependent. We will reinitiate  patient on dialysis on Wednesday.He has a right internal jugular PermCath in place.  2. Anemia of chronic kidney disease.   Hemoglobin down to 6.5. Consider blood transfusion but defer this to hospitalist.  3. Anasarca - intravenous albumin stopped. We will start the patient onultrafiltration with dialysis.  4. Acute respiratory failure. - patient remains on the ventilator at this point in time. Hopefully reinitiating the ulcers will help to wean the patient from the ventilator.  5. Acute urinary retention Now has foley   LOS: 0 Jonathan Thompson 7/9/20183:29 PM

## 2017-03-28 ENCOUNTER — Inpatient Hospital Stay (HOSPITAL_BASED_OUTPATIENT_CLINIC_OR_DEPARTMENT_OTHER)
Admission: AD | Admit: 2017-03-28 | Discharge: 2017-03-28 | Disposition: A | Discharge status: Home / Self Care | Payer: Self-pay | Source: Ambulatory Visit | Attending: Internal Medicine | Admitting: Internal Medicine

## 2017-03-28 DIAGNOSIS — Z01810 Encounter for preprocedural cardiovascular examination: Secondary | ICD-10-CM

## 2017-03-28 NOTE — Progress Notes (Signed)
Upper Extremity Vein Map  Right Cephalic Unable to visualize.  Right Basilic  Segment Diameter Depth Comment  1. Axilla     2. Mid upper arm     3. Above [facility] 6.2234mm 23.125mm Origin  4. In [facility] 4.1162mm 5.433mm   5. Below AC 2.2905mm 9.6459mm Branch  6. Mid forearm 2.2919mm 2.588mm   7. Wrist   Unable to visualize                   Left Cephalic Unable to visualize  Left Basilic  Segment Diameter Depth Comment  1. Axilla     2. Mid upper arm     3. Above [facility] 4.6669mm 23.499mm   4. In [facility]   Unable to visualize due to interstitial fluid  5. Below AC   Unable to visualize due to interstitial fluid  6. Mid forearm   Unable to visualize due to interstitial fluid  7. Wrist   Unable to visualize due to interstitial fluid                  03/28/2017 10:52 AM Gertie FeyMichelle Caleb Prigmore, BS, RVT, RDCS, RDMS

## 2017-03-29 LAB — CBC
HCT: 20 % — ABNORMAL LOW (ref 39.0–52.0)
Hemoglobin: 6.4 g/dL — CL (ref 13.0–17.0)
MCH: 29.6 pg (ref 26.0–34.0)
MCHC: 32 g/dL (ref 30.0–36.0)
MCV: 92.6 fL (ref 78.0–100.0)
PLATELETS: 253 10*3/uL (ref 150–400)
RBC: 2.16 MIL/uL — AB (ref 4.22–5.81)
RDW: 19 % — ABNORMAL HIGH (ref 11.5–15.5)
WBC: 6.2 10*3/uL (ref 4.0–10.5)

## 2017-03-29 LAB — RENAL FUNCTION PANEL
Albumin: 2.6 g/dL — ABNORMAL LOW (ref 3.5–5.0)
Anion gap: 13 (ref 5–15)
BUN: 170 mg/dL — ABNORMAL HIGH (ref 6–20)
CHLORIDE: 89 mmol/L — AB (ref 101–111)
CO2: 25 mmol/L (ref 22–32)
CREATININE: 3.42 mg/dL — AB (ref 0.61–1.24)
Calcium: 8.2 mg/dL — ABNORMAL LOW (ref 8.9–10.3)
GFR calc non Af Amer: 15 mL/min — ABNORMAL LOW (ref >60)
GFR, EST AFRICAN AMERICAN: 17 mL/min — AB (ref >60)
Glucose, Bld: 182 mg/dL — ABNORMAL HIGH (ref 65–99)
Phosphorus: 5.8 mg/dL — ABNORMAL HIGH (ref 2.5–4.6)
Potassium: 4 mmol/L (ref 3.5–5.1)
Sodium: 127 mmol/L — ABNORMAL LOW (ref 135–145)

## 2017-03-29 NOTE — Progress Notes (Signed)
[facility]  ROUNDING NOTE   Subjective:  Patient completed hemodialysis today. 2 units of PRBC transfusion were administered.   Objective:  Vital signs in last 24 hours:  Temperature 90.8 pulse 82 respirations 28 blood pressure 164/89   Physical Exam: General: No acute distress  Head: Normocephalic  Eyes: Anicteric  Neck: Trach in place  Lungs:  Scattered rhonchi, vent dependent  Heart: S1S2 no rubs  Abdomen:  Soft, nontender  Extremities: anasarca  Neurologic: Awake, alert, following commands   Skin: B/l feet in soft boot support   Rt IJ PC    Basic Metabolic Panel:  Recent Labs Lab 03/23/17 0623 03/24/17 0532 03/25/17 0613 03/27/17 0724 03/29/17 0707  NA 133* 131* 130* 130*  130* 127*  K 3.9 3.8 3.9 4.0  3.9 4.0  CL 95* 94* 92* 91*  91* 89*  CO2 29 26 27 23  24 25   GLUCOSE 166* 181* 171* 217*  218* 182*  BUN 75* 90* 106* 145*  143* 170*  CREATININE 2.00* 2.19* 2.37* 2.96*  2.94* 3.42*  CALCIUM 8.3* 8.4* 8.6* 8.7*  8.6* 8.2*  PHOS  --   --  4.2 6.3* 5.8*    Liver Function Tests:  Recent Labs Lab 03/25/17 0613 03/27/17 0724 03/29/17 0707  ALBUMIN 2.6* 3.0* 2.6*   No results for input(s): LIPASE, AMYLASE in the last 168 hours. No results for input(s): AMMONIA in the last 168 hours.  CBC:  Recent Labs Lab 03/27/17 0724 03/29/17 0707  WBC 5.8 6.2  HGB 6.5* 6.4*  HCT 20.7* 20.0*  MCV 94.1 92.6  PLT 214 253    Cardiac Enzymes: No results for input(s): CKTOTAL, CKMB, CKMBINDEX, TROPONINI in the last 168 hours.  BNP: Invalid input(s): POCBNP  CBG: No results for input(s): GLUCAP in the last 168 hours.  Microbiology: Results for orders placed or performed during the hospital encounter of 03/17/17  Culture, Urine     Status: Abnormal   Collection Time: 03/18/17 11:25 AM  Result Value Ref Range Status   Specimen Description URINE, RANDOM  Final   Special Requests NONE  Final   Culture >=100,000 COLONIES/mL PROTEUS  MIRABILIS (A)  Final   Report Status 03/20/2017 FINAL  Final   Organism ID, Bacteria PROTEUS MIRABILIS (A)  Final      Susceptibility   Proteus mirabilis - MIC*    AMPICILLIN >=32 RESISTANT Resistant     CEFAZOLIN 8 SENSITIVE Sensitive     CEFTRIAXONE <=1 SENSITIVE Sensitive     CIPROFLOXACIN <=0.25 SENSITIVE Sensitive     GENTAMICIN >=16 RESISTANT Resistant     IMIPENEM 4 SENSITIVE Sensitive     NITROFURANTOIN 128 RESISTANT Resistant     TRIMETH/SULFA <=20 SENSITIVE Sensitive     AMPICILLIN/SULBACTAM >=32 RESISTANT Resistant     PIP/TAZO <=4 SENSITIVE Sensitive     * >=100,000 COLONIES/mL PROTEUS MIRABILIS    Coagulation Studies: No results for input(s): LABPROT, INR in the last 72 hours.  Urinalysis: No results for input(s): COLORURINE, LABSPEC, PHURINE, GLUCOSEU, HGBUR, BILIRUBINUR, KETONESUR, PROTEINUR, UROBILINOGEN, NITRITE, LEUKOCYTESUR in the last 72 hours.  Invalid input(s): APPERANCEUR    Imaging: No results found.   Medications:       Assessment/ Plan:  83 y.o. male congestive heart failure, chronic kidney disease stage III, atrial fibrillation, diabetes mellitus type 2, coronary artery disease, hypothyroidism, hyperlipidemia, who was admitted to [facility] on 02/23/2017 for ongoing treatment.  Patient originally underwent CABG on 01/11/2017. The patient had decompensation post surgical  with a right ventricular failure and was placed on ECMO, taken off of ECMO on 01/18/17. He then developed acute renal failure and ended up requiring continuous renal replacement therapy and then the patient was transitioned to an intermittent hemodialysis. Patient had tracheostomy placed on 02/08/17 as well as a PEG tube on 02/10/17.   1.  ARF-  ATN  The patient has been requiring intermittent hemodialysis for over one month.  - Patient most likely will progress to end-stage renal disease. He has not done well off of dialysis. He was reinitiated on dialysis today. We will  plan for dialysis again on Friday.  2. Anemia of chronic kidney disease.   Patient was administered 2 units PRBCs today with dialysis. Follow-up CBC prior to the next treatment.  3. Anasarca - Net ultrafiltration today was only 0.7 kg. We will plan for 1.5-2 kg with a next treatment.  4. Acute respiratory failure. - Patient has been difficult to wean from the ventilator. Continue to monitor his respiratory status.  5. Acute urinary retention Now has foley   LOS: 0 Anees Vanecek 7/11/20183:06 PM

## 2017-03-30 DIAGNOSIS — N185 Chronic kidney disease, stage 5: Secondary | ICD-10-CM

## 2017-03-30 LAB — BPAM RBC
BLOOD PRODUCT EXPIRATION DATE: 201807132359
Blood Product Expiration Date: 201807132359
Blood Product Expiration Date: 201807172359
Blood Product Expiration Date: 201807172359
ISSUE DATE / TIME: 201807102049
ISSUE DATE / TIME: 201807111200
ISSUE DATE / TIME: 201807111255
ISSUE DATE / TIME: 201807111645
Unit Type and Rh: 6200
Unit Type and Rh: 6200
Unit Type and Rh: 6200
Unit Type and Rh: 6200

## 2017-03-30 LAB — TYPE AND SCREEN
ABO/RH(D): A POS
ANTIBODY SCREEN: NEGATIVE
UNIT DIVISION: 0
UNIT DIVISION: 0
UNIT DIVISION: 0
Unit division: 0

## 2017-03-30 NOTE — Consult Note (Signed)
CONSULT NOTE   MRN : [id number]  Reason for Consult: ESRD Requesting Physician: Dr. Sharyon Medicus  History of Present Illness:  Jonathan Thompson is a 83 y.o. (12/07/1928) male with a chief complaint of acute urinary retention. The patient is currently in a long-term intensive nursing facility.  Patient is unable to give history due to altered mental status.  History is reconstructed from the chart.  Patient originally underwent CABG on 01/11/2017. The patient had decompensation post surgical with a right ventricular failure and was placed on ECMO, taken off of ECMO on 01/18/17. He then developed acute renal failure and ended up requiring continuous renal replacement therapy and then the patient was transitioned to an intermittent hemodialysis. Patient had tracheostomy placed on 02/08/17 as well as a PEG tube on 02/10/17.   Nephrology feels the patient will likely deteriorate into permanent end stage renal disease requiring hemodialysis.  [facility] was consulted for permanent HD access.   Past Medical History: CAD s/p CABG,  Trach VDRF,  CHF,  DM,  Afib not on anticoagulation currently.     Past Surgical History: Status post CABG Tracheostomy 02/09/79 PEG tube 02/10/17 Right internal jugular PermCath  Social History Social History  Substance Use Topics  . Smoking status: Not on file  . Smokeless tobacco: Not on file  . Alcohol use Not on file   Family History Patient is unable to detail the medical history of his parents  Medications: Humalog sliding scale,  Atorvastatin 10 mg eac bedtime,  Pepcid 20 mg daily,  HCTZ 12.5 mg daily,  Synthroid 150 mg daily,  Losartan 50 mg daily,  Lovenox 30 mg daily,  Melatonin 3 mg each bedtime,  Metoprolol 50 mg twice a day,  Multivitamin 1 tablet daily,  Thiamine 100 mg daily  Allergies: SULFA  Review of Systems Can not be obtain due altered mental status  Physical Examination  Vitals   98.4 F, BP 143/76, HR 72, RR 21, SaO2 95%  General  non-interactive, awake, Ill appearing  Head /AT,   Ear/Nose/ Throat Unable to evaluate hearing, nares without erythema or drainage, cannot evaluate oropharynx as not responsive   Eyes PERRL, tracks with eyes in conjugate fashion,   Neck Supple, mid-line trachea, trach in place  Pulmonary Sym exp, mech vent sounds, rales on B  Cardiac RRR, Nl S1, S2, no Murmurs, No rubs, No S3,S4  Vascular Vessel Right Left  Radial Faintly palpable Faintly palpable  Brachial Faintly palpable Faintly palpable  Carotid Palpable, No Bruit Palpable, No Bruit  Aorta Not palpable N/A  Femoral Faintly palpable Faintly palpable  Popliteal Not palpable Not palpable  PT Not palpable Not palpable  DP Not palpable Not palpable    Gastro- intestinal soft, non-distended, non-tender to palpation, No guarding or rebound, no HSM, no masses, no CVAT B, No palpable prominent aortic pulse,    Musculo- skeletal Unable to test as unable to follow instructions, endo vein harvest site is dehisced, BLE edema 2+, dusky appearing feet  Neurologic Unable to test due to altered mental status  Psychiatric Minimal responsive to questioning  Dermatologic See M/S exam for extremity exam,   Lymphatic  Palpable lymph nodes: Inguinal    Significant Diagnostic Studies: CBC Lab Results  Component Value Date   WBC 6.2 03/29/2017   HGB 6.4 (LL) 03/29/2017   HCT 20.0 (L) 03/29/2017   MCV 92.6 03/29/2017   PLT 253 03/29/2017    BMET    Component Value Date/Time   NA 127 (L) 03/29/2017  0707   K 4.0 03/29/2017 0707   CL 89 (L) 03/29/2017 0707   CO2 25 03/29/2017 0707   GLUCOSE 182 (H) 03/29/2017 0707   BUN 170 (H) 03/29/2017 0707   CREATININE 3.42 (H) 03/29/2017 0707   CALCIUM 8.2 (L) 03/29/2017 0707   CALCIUM 8.3 (L) 03/01/2017 0532   GFRNONAA 15 (L) 03/29/2017 0707   GFRAA 17 (L) 03/29/2017 0707   CrCl cannot be calculated (Unknown ideal weight.).  COAG Lab Results  Component Value Date   INR 1.08 02/24/2017      Non-Invasive Vascular Imaging:  Left Basilic upper arm was the only vein visualize on vein mapping performed 03/28/2017.   ASSESSMENT/PLAN:  S/p CABG complicated by multisystem failure resulting in ESRD and severe deteriorating in functional status - Has Tunneled catheter currently - He is a critically ill gentleman.  He is unable to express his wishes and his family is considering palliative care verses permanent access.   - We will wait to see what the family decides.     Clinton GallantCOLLINS, EMMA [facility] 03/30/2017 9:45 AM   Addendum  Reportedly end of life discussions are being arranged with this patient's family.  Given this patient's extremely limited functional status, I don't see any benefit to permanent access placement.  I recommend: continued tunneled dialysis catheter use.  I had a frank discussion with the daughter in the room and she seems to imply that the family is not longer interested in further surgical procedures.  Available as needed.  Leonides SakeBrian Tarry Blayney, MD, FACS [facility] Office: [phone] Pager: [phone]  03/30/2017, 1:40 PM

## 2017-03-31 LAB — RENAL FUNCTION PANEL
ANION GAP: 11 (ref 5–15)
Albumin: 2.5 g/dL — ABNORMAL LOW (ref 3.5–5.0)
BUN: 141 mg/dL — ABNORMAL HIGH (ref 6–20)
CALCIUM: 8.3 mg/dL — AB (ref 8.9–10.3)
CHLORIDE: 92 mmol/L — AB (ref 101–111)
CO2: 26 mmol/L (ref 22–32)
Creatinine, Ser: 3.1 mg/dL — ABNORMAL HIGH (ref 0.61–1.24)
GFR calc non Af Amer: 17 mL/min — ABNORMAL LOW (ref >60)
GFR, EST AFRICAN AMERICAN: 20 mL/min — AB (ref >60)
GLUCOSE: 196 mg/dL — AB (ref 65–99)
Phosphorus: 5 mg/dL — ABNORMAL HIGH (ref 2.5–4.6)
Potassium: 3.7 mmol/L (ref 3.5–5.1)
SODIUM: 129 mmol/L — AB (ref 135–145)

## 2017-03-31 LAB — CBC
HCT: 26.1 % — ABNORMAL LOW (ref 39.0–52.0)
HEMOGLOBIN: 8.5 g/dL — AB (ref 13.0–17.0)
MCH: 29.2 pg (ref 26.0–34.0)
MCHC: 32.6 g/dL (ref 30.0–36.0)
MCV: 89.7 fL (ref 78.0–100.0)
Platelets: 206 10*3/uL (ref 150–400)
RBC: 2.91 MIL/uL — AB (ref 4.22–5.81)
RDW: 19.2 % — ABNORMAL HIGH (ref 11.5–15.5)
WBC: 5.8 10*3/uL (ref 4.0–10.5)

## 2017-03-31 NOTE — Progress Notes (Signed)
[facility]  ROUNDING NOTE   Subjective:  Patient seen at bedside. He is seen and evaluated during hemodialysis. Hemoglobin currently up to 8.5.   Objective:  Vital signs in last 24 hours:  Temperature 90.8 pulse 82 respirations 28 blood pressure 164/89   Physical Exam: General: No acute distress  Head: Normocephalic  Eyes: Anicteric  Neck: Trach in place  Lungs:  Scattered rhonchi, vent dependent  Heart: S1S2 no rubs  Abdomen:  Soft, nontender  Extremities: anasarca  Neurologic: Awake, alert, following commands   Skin: B/l feet in soft boot support   Rt IJ PC    Basic Metabolic Panel:  Recent Labs Lab 03/25/17 0613 03/27/17 0724 03/29/17 0707 03/31/17 0510  NA 130* 130*  130* 127* 129*  K 3.9 4.0  3.9 4.0 3.7  CL 92* 91*  91* 89* 92*  CO2 27 23  24 25 26   GLUCOSE 171* 217*  218* 182* 196*  BUN 106* 145*  143* 170* 141*  CREATININE 2.37* 2.96*  2.94* 3.42* 3.10*  CALCIUM 8.6* 8.7*  8.6* 8.2* 8.3*  PHOS 4.2 6.3* 5.8* 5.0*    Liver Function Tests:  Recent Labs Lab 03/25/17 [id number] 03/27/17 0724 03/29/17 0707 03/31/17 0510  ALBUMIN 2.6* 3.0* 2.6* 2.5*   No results for input(s): LIPASE, AMYLASE in the last 168 hours. No results for input(s): AMMONIA in the last 168 hours.  CBC:  Recent Labs Lab 03/27/17 0724 03/29/17 0707 03/31/17 0510  WBC 5.8 6.2 5.8  HGB 6.5* 6.4* 8.5*  HCT 20.7* 20.0* 26.1*  MCV 94.1 92.6 89.7  PLT 214 253 206    Cardiac Enzymes: No results for input(s): CKTOTAL, CKMB, CKMBINDEX, TROPONINI in the last 168 hours.  BNP: Invalid input(s): POCBNP  CBG: No results for input(s): GLUCAP in the last 168 hours.  Microbiology: Results for orders placed or performed during the hospital encounter of 03/17/17  Culture, Urine     Status: Abnormal   Collection Time: 03/18/17 11:25 AM  Result Value Ref Range Status   Specimen Description URINE, RANDOM  Final   Special Requests NONE  Final   Culture >=100,000  COLONIES/mL PROTEUS MIRABILIS (A)  Final   Report Status 03/20/2017 FINAL  Final   Organism ID, Bacteria PROTEUS MIRABILIS (A)  Final      Susceptibility   Proteus mirabilis - MIC*    AMPICILLIN >=32 RESISTANT Resistant     CEFAZOLIN 8 SENSITIVE Sensitive     CEFTRIAXONE <=1 SENSITIVE Sensitive     CIPROFLOXACIN <=0.25 SENSITIVE Sensitive     GENTAMICIN >=16 RESISTANT Resistant     IMIPENEM 4 SENSITIVE Sensitive     NITROFURANTOIN 128 RESISTANT Resistant     TRIMETH/SULFA <=20 SENSITIVE Sensitive     AMPICILLIN/SULBACTAM >=32 RESISTANT Resistant     PIP/TAZO <=4 SENSITIVE Sensitive     * >=100,000 COLONIES/mL PROTEUS MIRABILIS    Coagulation Studies: No results for input(s): LABPROT, INR in the last 72 hours.  Urinalysis: No results for input(s): COLORURINE, LABSPEC, PHURINE, GLUCOSEU, HGBUR, BILIRUBINUR, KETONESUR, PROTEINUR, UROBILINOGEN, NITRITE, LEUKOCYTESUR in the last 72 hours.  Invalid input(s): APPERANCEUR    Imaging: No results found.   Medications:       Assessment/ Plan:  83 y.o. male congestive heart failure, chronic kidney disease stage III, atrial fibrillation, diabetes mellitus type 2, coronary artery disease, hypothyroidism, hyperlipidemia, who was admitted [facility] on 02/23/2017 for ongoing treatment.  Patient originally underwent CABG on 01/11/2017. The patient had decompensation post surgical  with a right ventricular failure and was placed on ECMO, taken off of ECMO on 01/18/17. He then developed acute renal failure and ended up requiring continuous renal replacement therapy and then the patient was transitioned to an intermittent hemodialysis. Patient had tracheostomy placed on 02/08/17 as well as a PEG tube on 02/10/17.   1.  ARF-  ATN  The patient has been requiring intermittent hemodialysis for over one month.  - Patient reinitiated on dialysis on Wednesday of this week. It appears that he is most likely dialysis dependent. We will  likely make the determination of end-stage renal disease next week. Patient seen and evaluated during hemodialysis today. Next dialysis on Monday.  2. Anemia of chronic kidney disease.   Hemoglobin up to 8.5 post transfusion on Wednesday. Continue to monitor CBC.  3. Anasarca - Continue to use albumin to aid with ultrafiltration.  4. Acute respiratory failure. - Patient will likely have to be transferred to a facility that can accommodate patients who have a tracheostomy and are on the ventilator and can accommodate dialysis.  5. Acute urinary retention Now has foley   LOS: 0 Jonathan Thompson 7/13/20188:09 AM

## 2017-04-03 LAB — RENAL FUNCTION PANEL
Albumin: 2.3 g/dL — ABNORMAL LOW (ref 3.5–5.0)
Anion gap: 9 (ref 5–15)
BUN: 106 mg/dL — AB (ref 6–20)
CALCIUM: 8.4 mg/dL — AB (ref 8.9–10.3)
CO2: 27 mmol/L (ref 22–32)
CREATININE: 2.99 mg/dL — AB (ref 0.61–1.24)
Chloride: 94 mmol/L — ABNORMAL LOW (ref 101–111)
GFR, EST AFRICAN AMERICAN: 20 mL/min — AB (ref >60)
GFR, EST NON AFRICAN AMERICAN: 18 mL/min — AB (ref >60)
Glucose, Bld: 160 mg/dL — ABNORMAL HIGH (ref 65–99)
Phosphorus: 4 mg/dL (ref 2.5–4.6)
Potassium: 3.6 mmol/L (ref 3.5–5.1)
SODIUM: 130 mmol/L — AB (ref 135–145)

## 2017-04-03 LAB — CBC
HCT: 21 % — ABNORMAL LOW (ref 39.0–52.0)
Hemoglobin: 6.8 g/dL — CL (ref 13.0–17.0)
MCH: 29.7 pg (ref 26.0–34.0)
MCHC: 32.4 g/dL (ref 30.0–36.0)
MCV: 91.7 fL (ref 78.0–100.0)
PLATELETS: 179 10*3/uL (ref 150–400)
RBC: 2.29 MIL/uL — AB (ref 4.22–5.81)
RDW: 19.9 % — AB (ref 11.5–15.5)
WBC: 5.9 10*3/uL (ref 4.0–10.5)

## 2017-04-03 LAB — PREPARE RBC (CROSSMATCH)

## 2017-04-03 NOTE — Progress Notes (Signed)
[facility]  ROUNDING NOTE   Subjective:  Patient seen at bedside. He is Due for dialysis later today Urine output 1200 cc Continues to require ventilator through tracheostomy. FiO2 28%, PEEP 5  Objective:  Vital signs in last 24 hours:  Temperature 96, pulse 75, respirations 16, blood pressure 103/68   Physical Exam: General: No acute distress  Head: Normocephalic  Eyes: Anicteric  Neck: Trach in place  Lungs:  Scattered rhonchi, vent dependent  Heart: S1S2 no rubs  Abdomen:  Soft, nontender, PEG tube   Extremities: anasarca  Neurologic: Awake, alert, following commands   Skin: B/l feet in soft boot support   Rt IJ PC    Basic Metabolic Panel:  Recent Labs Lab 03/29/17 0707 03/31/17 0510 04/03/17 0517  NA 127* 129* 130*  K 4.0 3.7 3.6  CL 89* 92* 94*  CO2 25 26 27   GLUCOSE 182* 196* 160*  BUN 170* 141* 106*  CREATININE 3.42* 3.10* 2.99*  CALCIUM 8.2* 8.3* 8.4*  PHOS 5.8* 5.0* 4.0    Liver Function Tests:  Recent Labs Lab 03/29/17 0707 03/31/17 0510 04/03/17 0517  ALBUMIN 2.6* 2.5* 2.3*   No results for input(s): LIPASE, AMYLASE in the last 168 hours. No results for input(s): AMMONIA in the last 168 hours.  CBC:  Recent Labs Lab 03/29/17 0707 03/31/17 0510 04/03/17 0517  WBC 6.2 5.8 5.9  HGB 6.4* 8.5* 6.8*  HCT 20.0* 26.1* 21.0*  MCV 92.6 89.7 91.7  PLT 253 206 179    Cardiac Enzymes: No results for input(s): CKTOTAL, CKMB, CKMBINDEX, TROPONINI in the last 168 hours.  BNP: Invalid input(s): POCBNP  CBG: No results for input(s): GLUCAP in the last 168 hours.  Microbiology: Results for orders placed or performed during the hospital encounter of 03/17/17  Culture, Urine     Status: Abnormal   Collection Time: 03/18/17 11:25 AM  Result Value Ref Range Status   Specimen Description URINE, RANDOM  Final   Special Requests NONE  Final   Culture >=100,000 COLONIES/mL PROTEUS MIRABILIS (A)  Final   Report Status 03/20/2017  FINAL  Final   Organism ID, Bacteria PROTEUS MIRABILIS (A)  Final      Susceptibility   Proteus mirabilis - MIC*    AMPICILLIN >=32 RESISTANT Resistant     CEFAZOLIN 8 SENSITIVE Sensitive     CEFTRIAXONE <=1 SENSITIVE Sensitive     CIPROFLOXACIN <=0.25 SENSITIVE Sensitive     GENTAMICIN >=16 RESISTANT Resistant     IMIPENEM 4 SENSITIVE Sensitive     NITROFURANTOIN 128 RESISTANT Resistant     TRIMETH/SULFA <=20 SENSITIVE Sensitive     AMPICILLIN/SULBACTAM >=32 RESISTANT Resistant     PIP/TAZO <=4 SENSITIVE Sensitive     * >=100,000 COLONIES/mL PROTEUS MIRABILIS    Coagulation Studies: No results for input(s): LABPROT, INR in the last 72 hours.  Urinalysis: No results for input(s): COLORURINE, LABSPEC, PHURINE, GLUCOSEU, HGBUR, BILIRUBINUR, KETONESUR, PROTEINUR, UROBILINOGEN, NITRITE, LEUKOCYTESUR in the last 72 hours.  Invalid input(s): APPERANCEUR    Imaging: No results found.   Medications:       Assessment/ Plan:  83 y.o. male congestive heart failure, chronic kidney disease stage III, atrial fibrillation, diabetes mellitus type 2, coronary artery disease, hypothyroidism, hyperlipidemia, who was admitted to [facility] on 02/23/2017 for ongoing treatment.  Patient originally underwent CABG on 01/11/2017. The patient had decompensation post surgical with a right ventricular failure and was placed on ECMO, taken off of ECMO on 01/18/17. He then developed  acute renal failure and ended up requiring continuous renal replacement therapy and then the patient was transitioned to an intermittent hemodialysis. Patient had tracheostomy placed on 02/08/17 as well as a PEG tube on 02/10/17.   1.  Patient remains dialysis dependent and has likely progressed to ESRD  The patient has been requiring intermittent hemodialysis for over one month.  - We will continue scheduled dialysis for him Monday, Wednesday, Friday and consider aggressive fluid removal  2. Anemia of chronic  kidney disease.   Hemoglobin 6.8 Continued on Aranesp 100 g weekly  3. Anasarca - Continue to use albumin to aid with ultrafiltration. -d/w dietician reduce water flushes to 30 cc q 4 hrs - Check TSH  4. Acute respiratory failure. - Patient will likely have to be transferred to a facility that can accommodate patients who have a tracheostomy and are on the ventilator and can accommodate dialysis.  5. Acute urinary retention Now has foley   LOS: 0 Dailon Sheeran 7/16/20183:08 PM

## 2017-04-04 LAB — HEMOGLOBIN AND HEMATOCRIT, BLOOD
HCT: 24.2 % — ABNORMAL LOW (ref 39.0–52.0)
Hemoglobin: 7.4 g/dL — ABNORMAL LOW (ref 13.0–17.0)

## 2017-04-04 LAB — TSH: TSH: 30.837 u[IU]/mL — ABNORMAL HIGH (ref 0.350–4.500)

## 2017-04-05 LAB — CBC
HEMATOCRIT: 20 % — AB (ref 39.0–52.0)
HEMATOCRIT: 23.5 % — AB (ref 39.0–52.0)
HEMOGLOBIN: 6.4 g/dL — AB (ref 13.0–17.0)
HEMOGLOBIN: 7.5 g/dL — AB (ref 13.0–17.0)
MCH: 29.9 pg (ref 26.0–34.0)
MCH: 29.1 pg (ref 26.0–34.0)
MCHC: 32 g/dL (ref 30.0–36.0)
MCHC: 31.9 g/dL (ref 30.0–36.0)
MCV: 93.5 fL (ref 78.0–100.0)
MCV: 91.1 fL (ref 78.0–100.0)
Platelets: 156 10*3/uL (ref 150–400)
Platelets: 155 10*3/uL (ref 150–400)
RBC: 2.14 MIL/uL — AB (ref 4.22–5.81)
RBC: 2.58 MIL/uL — ABNORMAL LOW (ref 4.22–5.81)
RDW: 19.7 % — ABNORMAL HIGH (ref 11.5–15.5)
RDW: 17.6 % — AB (ref 11.5–15.5)
WBC: 5.8 10*3/uL (ref 4.0–10.5)
WBC: 6.8 10*3/uL (ref 4.0–10.5)

## 2017-04-05 LAB — HEMOGLOBIN AND HEMATOCRIT, BLOOD
HCT: 18.8 % — ABNORMAL LOW (ref 39.0–52.0)
HEMATOCRIT: 23.8 % — AB (ref 39.0–52.0)
HEMOGLOBIN: 6 g/dL — AB (ref 13.0–17.0)
Hemoglobin: 7.7 g/dL — ABNORMAL LOW (ref 13.0–17.0)

## 2017-04-05 LAB — RENAL FUNCTION PANEL
ALBUMIN: 2.2 g/dL — AB (ref 3.5–5.0)
ANION GAP: 7 (ref 5–15)
BUN: 70 mg/dL — ABNORMAL HIGH (ref 6–20)
CALCIUM: 8.3 mg/dL — AB (ref 8.9–10.3)
CO2: 27 mmol/L (ref 22–32)
Chloride: 99 mmol/L — ABNORMAL LOW (ref 101–111)
Creatinine, Ser: 2.27 mg/dL — ABNORMAL HIGH (ref 0.61–1.24)
GFR calc Af Amer: 29 mL/min — ABNORMAL LOW (ref >60)
GFR, EST NON AFRICAN AMERICAN: 25 mL/min — AB (ref >60)
GLUCOSE: 184 mg/dL — AB (ref 65–99)
PHOSPHORUS: 3 mg/dL (ref 2.5–4.6)
POTASSIUM: 3.6 mmol/L (ref 3.5–5.1)
SODIUM: 133 mmol/L — AB (ref 135–145)

## 2017-04-05 LAB — OCCULT BLOOD X 1 CARD TO LAB, STOOL: FECAL OCCULT BLD: POSITIVE — AB

## 2017-04-05 LAB — PREPARE RBC (CROSSMATCH)

## 2017-04-05 NOTE — Progress Notes (Signed)
[facility]  ROUNDING NOTE   Subjective:    He is due for dialysis later today Urine output 250 cc Continues to require ventilator through tracheostomy. FiO2 28%, PEEP 5 Hgb remains low from GI bleed Tube feeds are on hold 2000 cc removed with HD on MOnday  Objective:  Vital signs in last 24 hours:  Temperature 94.9, pulse 75, respirations 18, blood pressure 103/62   Physical Exam: General: No acute distress  Head: Normocephalic  Eyes: Anicteric  Neck: Trach in place  Lungs:  Scattered rhonchi, vent dependent  Heart: S1S2 no rubs  Abdomen:  Soft, nontender, PEG tube   Extremities: anasarca  Neurologic: Resting quietly  Skin: B/l feet in soft boot support   Rt IJ PC    Basic Metabolic Panel:  Recent Labs Lab 03/31/17 0510 04/03/17 0517 04/05/17 0526  NA 129* 130* 133*  K 3.7 3.6 3.6  CL 92* 94* 99*  CO2 26 27 27   GLUCOSE 196* 160* 184*  BUN 141* 106* 70*  CREATININE 3.10* 2.99* 2.27*  CALCIUM 8.3* 8.4* 8.3*  PHOS 5.0* 4.0 3.0    Liver Function Tests:  Recent Labs Lab 03/31/17 0510 04/03/17 0517 04/05/17 0526  ALBUMIN 2.5* 2.3* 2.2*   No results for input(s): LIPASE, AMYLASE in the last 168 hours. No results for input(s): AMMONIA in the last 168 hours.  CBC:  Recent Labs Lab 03/31/17 0510 04/03/17 0517 04/04/17 2320 04/05/17 0526  WBC 5.8 5.9  --  5.8  HGB 8.5* 6.8* 7.4* 6.4*  HCT 26.1* 21.0* 24.2* 20.0*  MCV 89.7 91.7  --  93.5  PLT 206 179  --  156    Cardiac Enzymes: No results for input(s): CKTOTAL, CKMB, CKMBINDEX, TROPONINI in the last 168 hours.  BNP: Invalid input(s): POCBNP  CBG: No results for input(s): GLUCAP in the last 168 hours.  Microbiology: Results for orders placed or performed during the hospital encounter of 03/17/17  Culture, Urine     Status: Abnormal   Collection Time: 03/18/17 11:25 AM  Result Value Ref Range Status   Specimen Description URINE, RANDOM  Final   Special Requests NONE  Final    Culture >=100,000 COLONIES/mL PROTEUS MIRABILIS (A)  Final   Report Status 03/20/2017 FINAL  Final   Organism ID, Bacteria PROTEUS MIRABILIS (A)  Final      Susceptibility   Proteus mirabilis - MIC*    AMPICILLIN >=32 RESISTANT Resistant     CEFAZOLIN 8 SENSITIVE Sensitive     CEFTRIAXONE <=1 SENSITIVE Sensitive     CIPROFLOXACIN <=0.25 SENSITIVE Sensitive     GENTAMICIN >=16 RESISTANT Resistant     IMIPENEM 4 SENSITIVE Sensitive     NITROFURANTOIN 128 RESISTANT Resistant     TRIMETH/SULFA <=20 SENSITIVE Sensitive     AMPICILLIN/SULBACTAM >=32 RESISTANT Resistant     PIP/TAZO <=4 SENSITIVE Sensitive     * >=100,000 COLONIES/mL PROTEUS MIRABILIS    Coagulation Studies: No results for input(s): LABPROT, INR in the last 72 hours.  Urinalysis: No results for input(s): COLORURINE, LABSPEC, PHURINE, GLUCOSEU, HGBUR, BILIRUBINUR, KETONESUR, PROTEINUR, UROBILINOGEN, NITRITE, LEUKOCYTESUR in the last 72 hours.  Invalid input(s): APPERANCEUR    Imaging: No results found.   Medications:       Assessment/ Plan:  83 y.o. male congestive heart failure, chronic kidney disease stage III, atrial fibrillation, diabetes mellitus type 2, coronary artery disease, hypothyroidism, hyperlipidemia, who was admitted to [facility] on 02/23/2017 for ongoing treatment.  Patient originally underwent CABG on  01/11/2017. The patient had decompensation post surgical with a right ventricular failure and was placed on ECMO, taken off of ECMO on 01/18/17. He then developed acute renal failure and ended up requiring continuous renal replacement therapy and then the patient was transitioned to an intermittent hemodialysis. Patient had tracheostomy placed on 02/08/17 as well as a PEG tube on 02/10/17.   1.  ESRD  The patient has been requiring intermittent hemodialysis prior to this admission - We will continue scheduled dialysis for him Monday, Wednesday, Friday and consider aggressive fluid  removal - Midodrine for BP support  2. Anemia of chronic kidney disease and blood loss.   Hemoglobin 6.8 Continued on Aranesp 100 g weekly Concern for GI bleed. GI evaluation pending  3. Anasarca - Continue to use albumin to aid with ultrafiltration. -d/w dietician reduce water flushes to 30 cc q 4 hrs  4. Acute respiratory failure. - Patient will likely have to be transferred to a facility that can accommodate patients who have a tracheostomy and are on the ventilator and can accommodate dialysis.  5. Acute urinary retention Now has foley   LOS: 0 Ivionna Verley 7/18/20189:35 AM

## 2017-04-06 LAB — BPAM RBC
BLOOD PRODUCT EXPIRATION DATE: 201808022359
Blood Product Expiration Date: 201807252359
ISSUE DATE / TIME: 201807161746
ISSUE DATE / TIME: 201807181133
UNIT TYPE AND RH: 6200
UNIT TYPE AND RH: 6200

## 2017-04-06 LAB — HEMOGLOBIN AND HEMATOCRIT, BLOOD
HCT: 22.7 % — ABNORMAL LOW (ref 39.0–52.0)
HCT: 24.3 % — ABNORMAL LOW (ref 39.0–52.0)
HEMATOCRIT: 23.4 % — AB (ref 39.0–52.0)
HEMOGLOBIN: 7.7 g/dL — AB (ref 13.0–17.0)
Hemoglobin: 7.4 g/dL — ABNORMAL LOW (ref 13.0–17.0)
Hemoglobin: 7.7 g/dL — ABNORMAL LOW (ref 13.0–17.0)

## 2017-04-06 LAB — TYPE AND SCREEN
ABO/RH(D): A POS
ANTIBODY SCREEN: NEGATIVE
UNIT DIVISION: 0
UNIT DIVISION: 0

## 2017-04-07 ENCOUNTER — Ambulatory Visit (HOSPITAL_COMMUNITY): Admit: 2017-04-07 | Payer: Self-pay | Admitting: Gastroenterology

## 2017-04-07 ENCOUNTER — Encounter (HOSPITAL_COMMUNITY): Payer: Self-pay

## 2017-04-07 SURGERY — ESOPHAGOGASTRODUODENOSCOPY (EGD) WITH PROPOFOL
Anesthesia: Monitor Anesthesia Care

## 2017-04-19 DEATH — deceased

## 2019-06-08 IMAGING — CR DG ABD PORTABLE 1V
2 series · 2 of 2 positions shown · non-contrast
Comparison: 03/10/2017

CLINICAL DATA: Ileus.

EXAM:
PORTABLE ABDOMEN - 1 VIEW

[AP (1 of 2)]
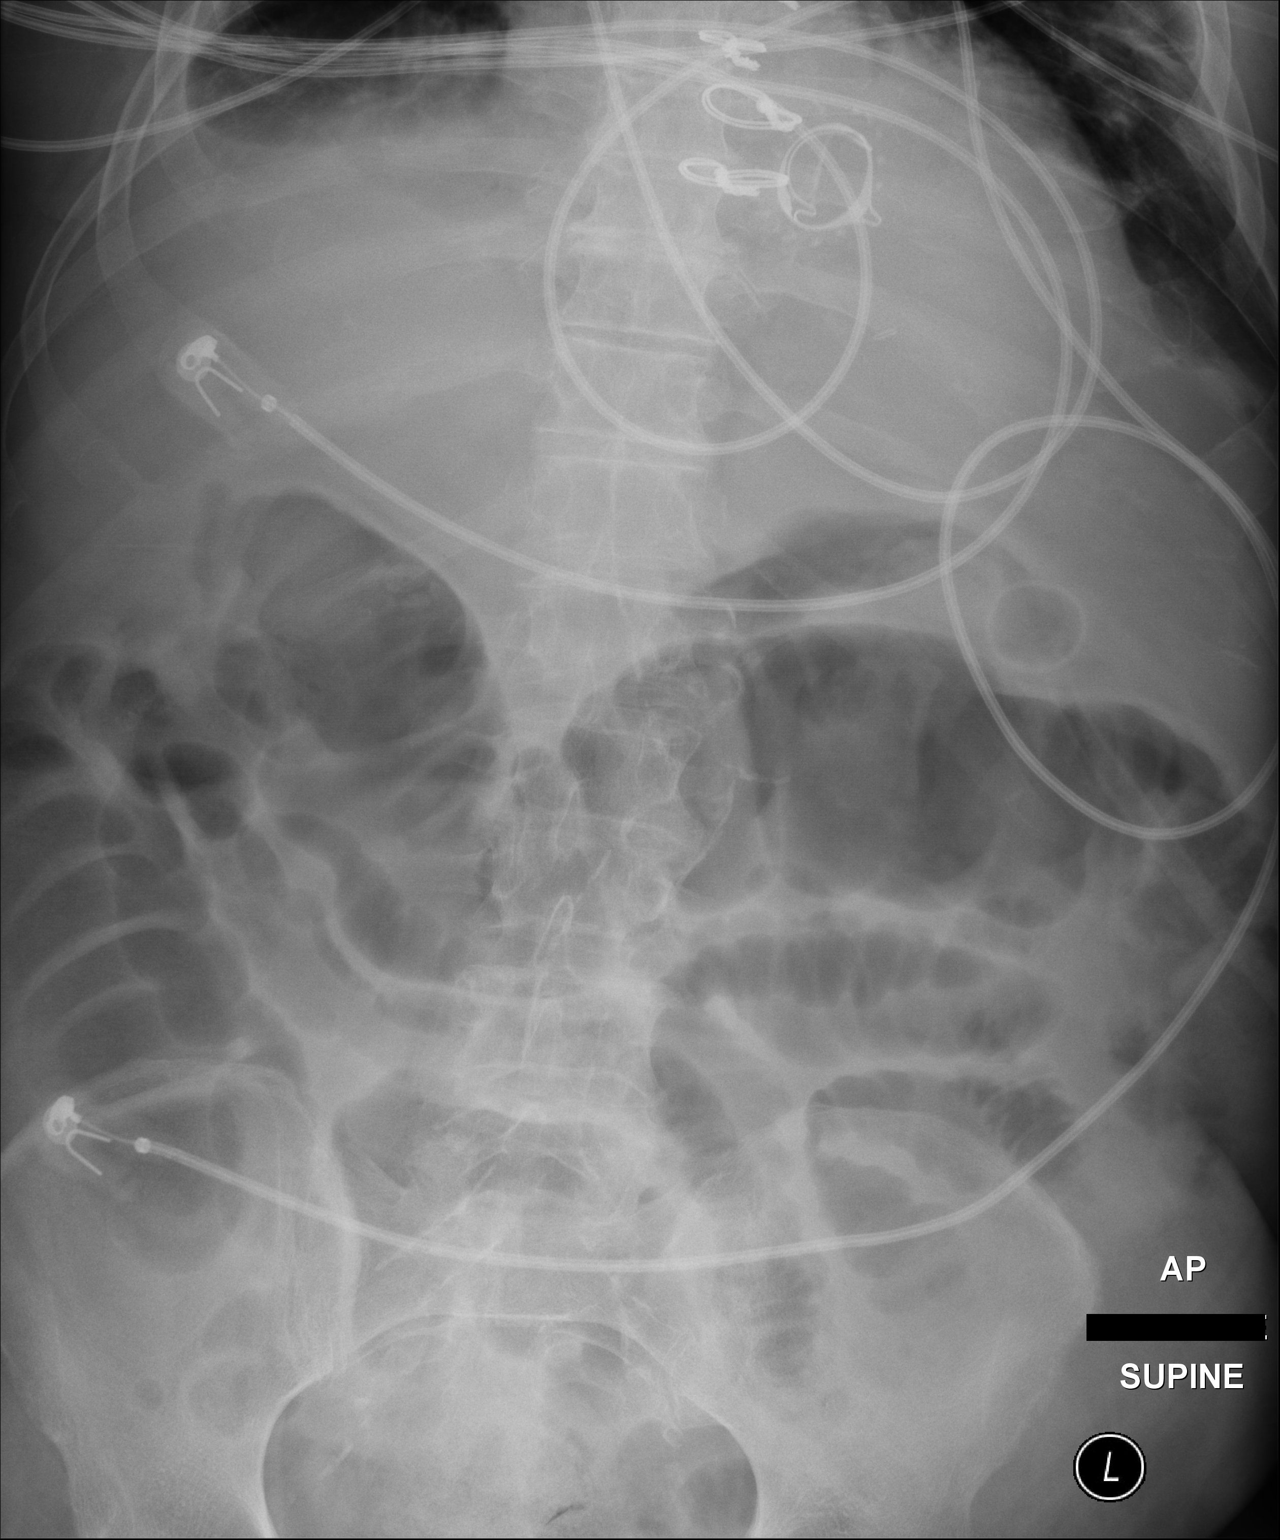

[AP (2 of 2)]
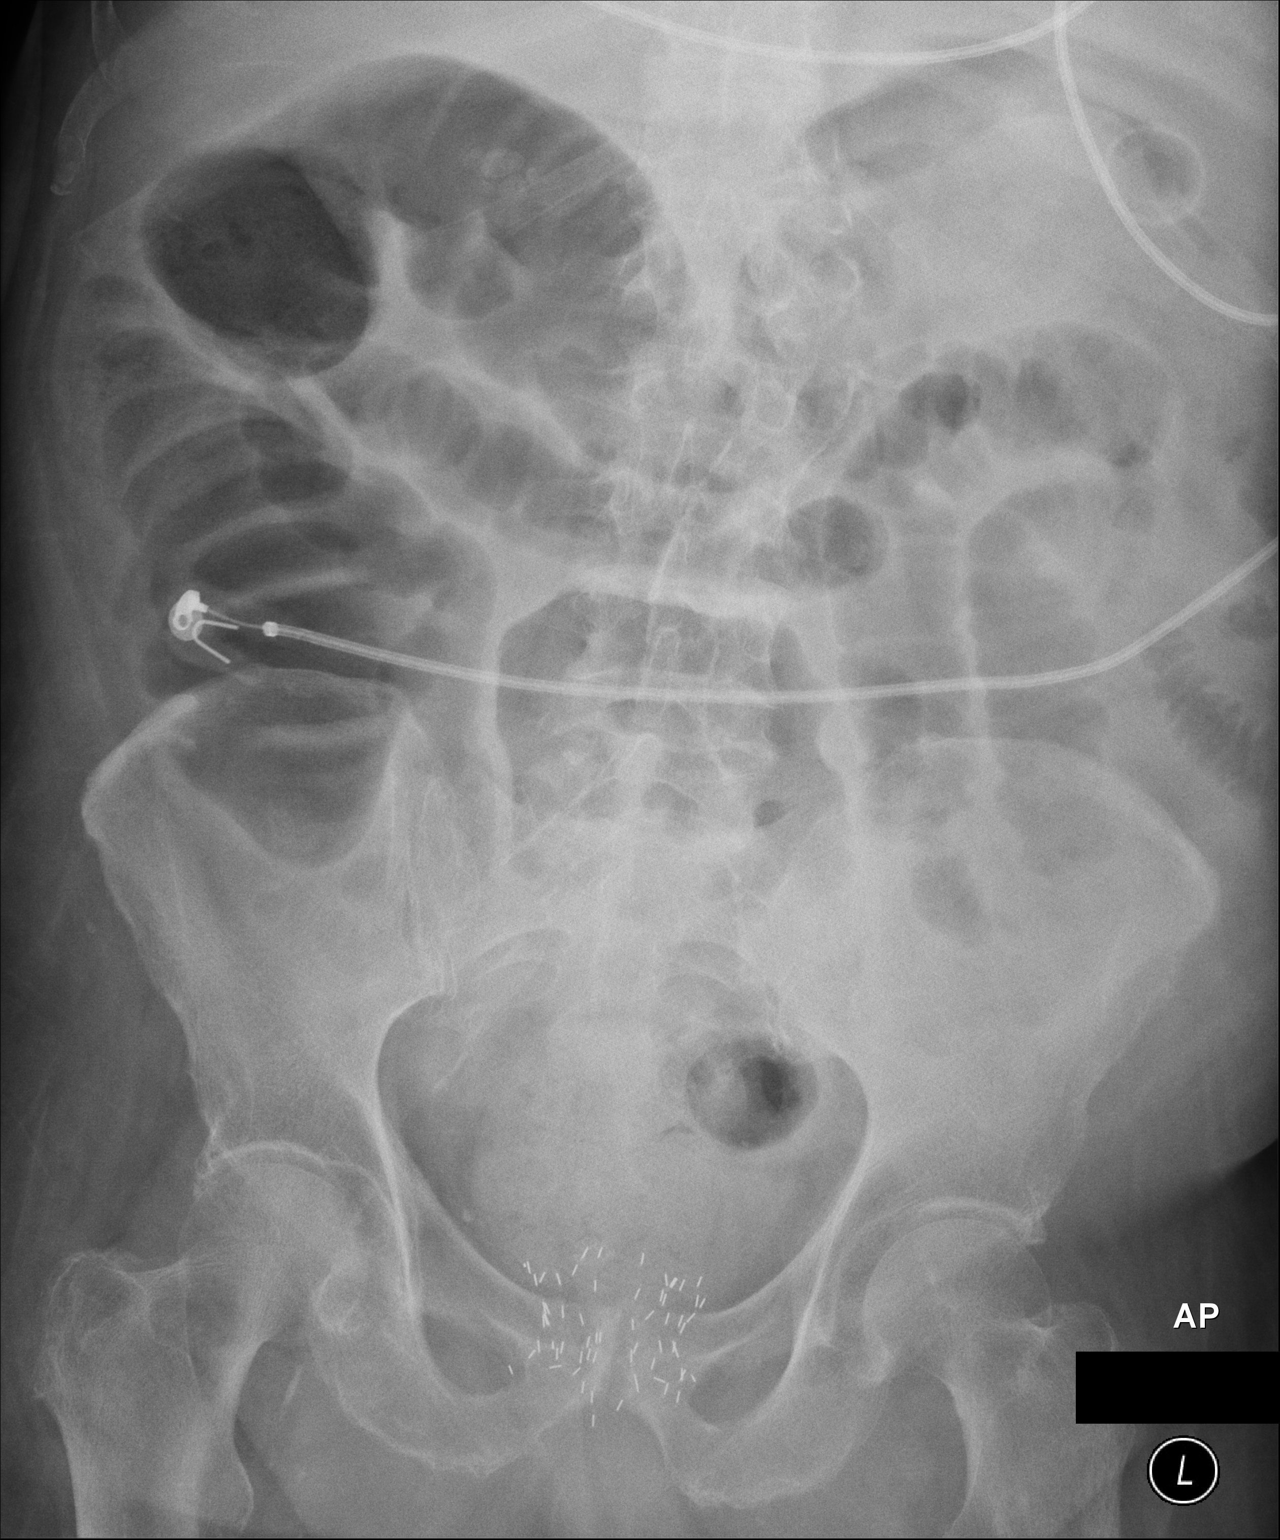

[2 of 2 positions shown; findings below may reference images not displayed]

FINDINGS: There is mild gaseous distention of multiple small and large bowel
loops, overall decreased from the prior study. No definite
intraperitoneal free air is identified within limitations of supine
technique. A gastrostomy tube and prostate brachytherapy seeds are
again noted. Sequelae of prior sternotomy and cardiac valve
replacement are identified.

A pleural effusion is partially visualized in the right lung base.
IMPRESSION: Mildly decreased small and large bowel distention compatible with
improving ileus.

## 2019-06-19 IMAGING — DX DG CHEST 1V PORT
1 series · 1 of 1 positions shown · non-contrast
Comparison: 03/03/2017

CLINICAL DATA: Respiratory failure

EXAM:
PORTABLE CHEST 1 VIEW

[chest ap]
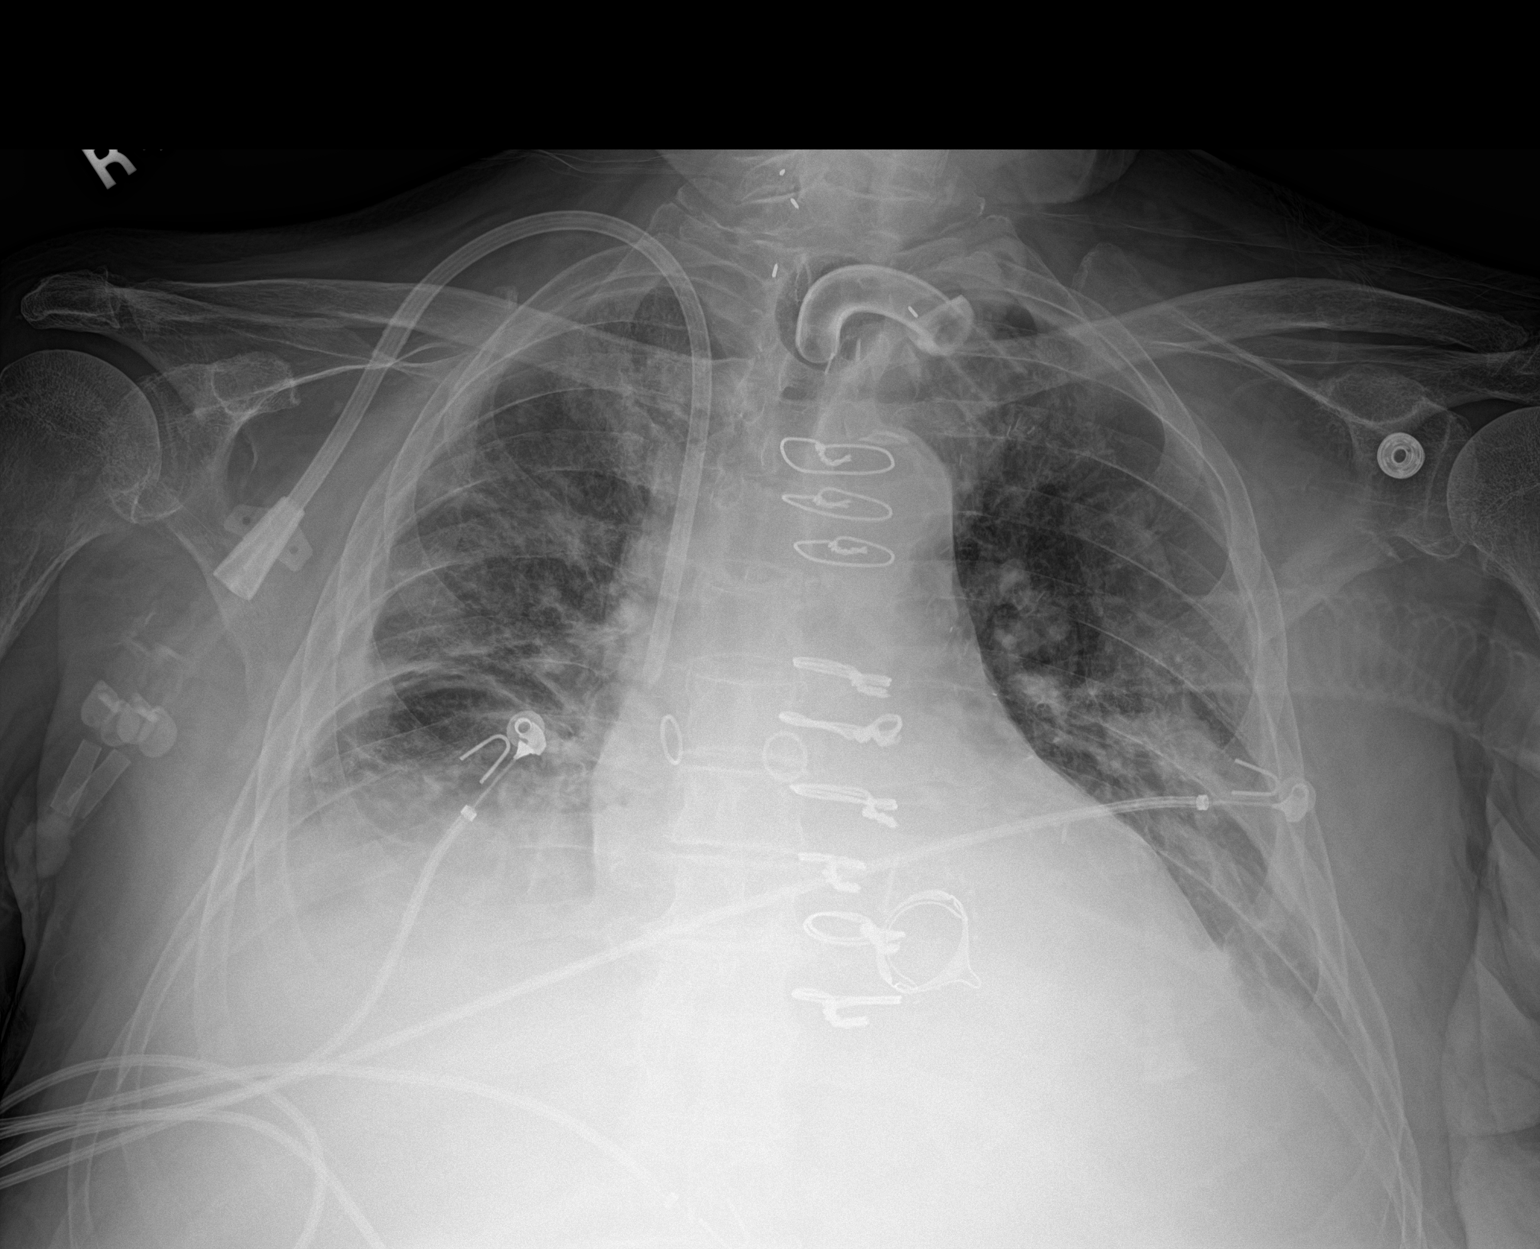

[1 of 1 positions shown; findings below may reference images not displayed]

FINDINGS: Chronic cardiomegaly. Status post mitral valve replacement and CABG.
Oxendine catheter on the right with tip at the upper cavoatrial
junction. Tracheostomy tube present.

Unchanged streaky opacities in the bilateral low volume lungs.
Probable small pleural effusions. Stable patchy ground-glass density
greater on the right. No pneumothorax.
IMPRESSION: 1. Stable compared to 03/03/2017.
2. Low volume chest with bilateral atelectasis or scarring.
Superimposed pneumonia could be obscured.
